# Patient Record
Sex: Female | Born: 1985 | Race: Black or African American
Health system: Southern US, Community
[De-identification: ages and names within clinical notes are randomized; demographics above are authoritative.]

## PROBLEM LIST (undated history)

## (undated) DIAGNOSIS — F32A Depression, unspecified: Secondary | ICD-10-CM

## (undated) DIAGNOSIS — F419 Anxiety disorder, unspecified: Secondary | ICD-10-CM

## (undated) DIAGNOSIS — D649 Anemia, unspecified: Secondary | ICD-10-CM

## (undated) HISTORY — DX: Anxiety disorder, unspecified: F41.9

## (undated) HISTORY — DX: Depression, unspecified: F32.A

## (undated) HISTORY — PX: WISDOM TOOTH EXTRACTION: SHX21

---
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
## 2018-09-25 ENCOUNTER — Emergency Department
Admission: EM | Admit: 2018-09-25 | Discharge: 2018-09-25 | Disposition: A | Discharge status: Home / Self Care | Payer: Self-pay | Attending: Emergency Medicine | Admitting: Emergency Medicine

## 2018-09-25 ENCOUNTER — Emergency Department: Payer: Self-pay

## 2018-09-25 DIAGNOSIS — R1030 Lower abdominal pain, unspecified: Secondary | ICD-10-CM | POA: Insufficient documentation

## 2018-09-25 DIAGNOSIS — O9989 Other specified diseases and conditions complicating pregnancy, childbirth and the puerperium: Secondary | ICD-10-CM | POA: Insufficient documentation

## 2018-09-25 DIAGNOSIS — O26892 Other specified pregnancy related conditions, second trimester: Secondary | ICD-10-CM

## 2018-09-25 DIAGNOSIS — Z3A16 16 weeks gestation of pregnancy: Secondary | ICD-10-CM | POA: Insufficient documentation

## 2018-09-25 LAB — CBC AND DIFFERENTIAL
Absolute NRBC: 0 10*3/uL (ref 0.00–0.00)
Basophils Absolute Automated: 0.02 10*3/uL (ref 0.00–0.08)
Basophils Automated: 0.2 %
Eosinophils Absolute Automated: 0.06 10*3/uL (ref 0.00–0.44)
Eosinophils Automated: 0.6 %
Hematocrit: 30.2 % — ABNORMAL LOW (ref 34.7–43.7)
Hgb: 10.2 g/dL — ABNORMAL LOW (ref 11.4–14.8)
Immature Granulocytes Absolute: 0.05 10*3/uL (ref 0.00–0.07)
Immature Granulocytes: 0.5 %
Lymphocytes Absolute Automated: 2.67 10*3/uL (ref 0.42–3.22)
Lymphocytes Automated: 25.9 %
MCH: 29.4 pg (ref 25.1–33.5)
MCHC: 33.8 g/dL (ref 31.5–35.8)
MCV: 87 fL (ref 78.0–96.0)
MPV: 9.9 fL (ref 8.9–12.5)
Monocytes Absolute Automated: 0.68 10*3/uL (ref 0.21–0.85)
Monocytes: 6.6 %
Neutrophils Absolute: 6.84 10*3/uL — ABNORMAL HIGH (ref 1.10–6.33)
Neutrophils: 66.2 %
Nucleated RBC: 0 /100 WBC (ref 0.0–0.0)
Platelets: 224 10*3/uL (ref 142–346)
RBC: 3.47 10*6/uL — ABNORMAL LOW (ref 3.90–5.10)
RDW: 13 % (ref 11–15)
WBC: 10.32 10*3/uL — ABNORMAL HIGH (ref 3.10–9.50)

## 2018-09-25 LAB — COMPREHENSIVE METABOLIC PANEL
ALT: <6 U/L (ref 0–55)
AST (SGOT): 9 U/L (ref 5–34)
Albumin/Globulin Ratio: 0.8 — ABNORMAL LOW (ref 0.9–2.2)
Albumin: 3 g/dL — ABNORMAL LOW (ref 3.5–5.0)
Alkaline Phosphatase: 77 U/L (ref 37–106)
Anion Gap: 7 (ref 5.0–15.0)
BUN: 11 mg/dL (ref 7.0–19.0)
Bilirubin, Total: 0.2 mg/dL (ref 0.2–1.2)
CO2: 25 mEq/L (ref 22–29)
Calcium: 8.8 mg/dL (ref 8.5–10.5)
Chloride: 106 mEq/L (ref 100–111)
Creatinine: 0.6 mg/dL (ref 0.6–1.0)
Globulin: 3.6 g/dL (ref 2.0–3.6)
Glucose: 107 mg/dL — ABNORMAL HIGH (ref 70–100)
Potassium: 3.9 mEq/L (ref 3.5–5.1)
Protein, Total: 6.6 g/dL (ref 6.0–8.3)
Sodium: 138 mEq/L (ref 136–145)

## 2018-09-25 LAB — URINALYSIS REFLEX TO MICROSCOPIC EXAM - REFLEX TO CULTURE
Bilirubin, UA: NEGATIVE
Glucose, UA: NEGATIVE
Leukocyte Esterase, UA: NEGATIVE
Nitrite, UA: NEGATIVE
Protein, UR: NEGATIVE
Specific Gravity UA: >1.03 (ref 1.001–1.035)
Urine pH: 6 (ref 5.0–8.0)
Urobilinogen, UA: 0.2 mg/dL (ref 0.2–2.0)

## 2018-09-25 LAB — LIPASE: Lipase: 12 U/L (ref 8–78)

## 2018-09-25 LAB — GFR: EGFR: >60

## 2018-09-25 LAB — ABO/RH: ABO Rh: O POS

## 2018-09-25 MED ORDER — ACETAMINOPHEN 325 MG PO TABS
650.0000 mg | ORAL_TABLET | Freq: Once | ORAL | Status: AC
Start: 2018-09-25 — End: 2018-09-25
  Administered 2018-09-25: 21:00:00 650 mg via ORAL
  Filled 2018-09-25: qty 2

## 2018-09-25 MED ORDER — SODIUM CHLORIDE 0.9 % IV BOLUS
1000.00 mL | Freq: Once | INTRAVENOUS | Status: AC
Start: 2018-09-25 — End: 2018-09-25
  Administered 2018-09-25: 21:00:00 1000 mL via INTRAVENOUS

## 2018-09-25 NOTE — ED Provider Notes (Signed)
EMERGENCY DEPARTMENT HISTORY AND PHYSICAL EXAM     Physician/Midlevel provider first contact with patient: 09/25/18 2055         Date: 09/25/2018  Patient Name: Casey Yates    History of Presenting Illness and Plan     Chief Complaint   Patient presents with   . Abdominal Pain       History Provided By: Patient    Chief Complaint: Abdominal pain  Duration: Hours  Timing: Gradual  Location: Suprapubic  Quality: Aching  Severity: Mild  Exacerbating factors: None  Alleviating factors: None  Associated Symptoms: None  Pertinent Negatives: Fever, dysuria, vaginal bleeding.         PCP: Pcp, None, MD  SPECIALISTS:    Current Facility-Administered Medications   Medication Dose Route Frequency Provider Last Rate Last Dose   . sodium chloride 0.9 % bolus 1,000 mL  1,000 mL Intravenous Once Suzy Bouchard, MD 1,000 mL/hr at 09/25/18 2111 1,000 mL at 09/25/18 2111     Current Outpatient Medications   Medication Sig Dispense Refill   . Prenatal Multivit-Min-Fe-FA (PRENATAL VITAMINS PO) Take by mouth         Past History     Past Medical History:  History reviewed. No pertinent past medical history.    Past Surgical History:  History reviewed. No pertinent surgical history.    Family History:  Family History   Family history unknown: Yes       Social History:  Social History     Tobacco Use   . Smoking status: Never Smoker   . Smokeless tobacco: Never Used   Substance Use Topics   . Alcohol use: Never     Frequency: Never   . Drug use: Never       Allergies:  No Known Allergies    Review of Systems     Review of Systems   Constitutional: Negative for fever.   HENT: Negative for congestion.    Eyes: Negative for pain.   Respiratory: Negative for shortness of breath.    Cardiovascular: Negative for chest pain.   Gastrointestinal: Positive for abdominal pain. Negative for diarrhea, nausea and vomiting.   Musculoskeletal: Negative for back pain.   Skin: Negative for rash.   Neurological: Negative for syncope.    Psychiatric/Behavioral: Negative for agitation.       Physical Exam   BP 129/62   Pulse 94   Temp 98.3 F (36.8 C)   Resp 18   Ht 5\' 1"  (1.549 m)   Wt 86.2 kg   SpO2 100%   BMI 35.90 kg/m     Physical Exam  Vitals signs and nursing note reviewed.   Constitutional:       Appearance: She is well-developed.   HENT:      Head: Normocephalic.   Eyes:      Conjunctiva/sclera: Conjunctivae normal.   Neck:      Musculoskeletal: Neck supple.   Cardiovascular:      Rate and Rhythm: Normal rate and regular rhythm.   Pulmonary:      Effort: Pulmonary effort is normal. No respiratory distress.      Breath sounds: Normal breath sounds. No wheezing.   Abdominal:      Palpations: Abdomen is soft.      Tenderness: There is tenderness in the suprapubic area.   Musculoskeletal:         General: No deformity.   Skin:     General: Skin is warm.   Neurological:  Mental Status: She is alert.         Diagnostic Study Results     Labs -     Results     Procedure Component Value Units Date/Time    GFR [[id number]] Collected:  09/25/18 2104     Updated:  09/25/18 2157     EGFR >60.0    Narrative:       Replace urinary catheter prior to obtaining the urine culture  if it has been in place for greater than or equal to 14  days:->No  Indications for U/A Reflex to Micro - Reflex to  Culture:->Suprapubic Pain/Tenderness or Dysuria    Comprehensive metabolic panel [[id number]]  (Abnormal) Collected:  09/25/18 2104    Specimen:  Blood Updated:  09/25/18 2157     Glucose 107 mg/dL      BUN 78.2 mg/dL      Creatinine 0.6 mg/dL      Sodium 956 mEq/L      Potassium 3.9 mEq/L      Chloride 106 mEq/L      CO2 25 mEq/L      Calcium 8.8 mg/dL      Protein, Total 6.6 g/dL      Albumin 3.0 g/dL      AST (SGOT) 9 U/L      ALT <6 U/L      Alkaline Phosphatase 77 U/L      Bilirubin, Total 0.2 mg/dL      Globulin 3.6 g/dL      Albumin/Globulin Ratio 0.8     Anion Gap 7.0    Narrative:       Replace urinary catheter prior to obtaining the urine  culture  if it has been in place for greater than or equal to 14  days:->No  Indications for U/A Reflex to Micro - Reflex to  Culture:->Suprapubic Pain/Tenderness or Dysuria    Lipase [[id number]] Collected:  09/25/18 2104    Specimen:  Blood Updated:  09/25/18 2157     Lipase 12 U/L     Narrative:       Replace urinary catheter prior to obtaining the urine culture  if it has been in place for greater than or equal to 14  days:->No  Indications for U/A Reflex to Micro - Reflex to  Culture:->Suprapubic Pain/Tenderness or Dysuria    UA Reflex to Micro - Reflex to Culture [[id number]]  (Abnormal) Collected:  09/25/18 2104     Updated:  09/25/18 2120     Urine Type Urine, Clean Ca     Color, UA Yellow     Clarity, UA Clear     Specific Gravity UA >1.030     Urine pH 6.0     Leukocyte Esterase, UA Negative     Nitrite, UA Negative     Protein, UR Negative     Glucose, UA Negative     Ketones UA Trace     Urobilinogen, UA 0.2 mg/dL      Bilirubin, UA Negative     Blood, UA Trace     RBC, UA 3 - 5 /hpf      WBC, UA 0-5 /hpf      Squamous Epithelial Cells, Urine 6-10 /hpf     Narrative:       Replace urinary catheter prior to obtaining the urine culture  if it has been in place for greater than or equal to 14  days:->No  Indications for U/A Reflex to Micro - Reflex to  Culture:->Suprapubic Pain/Tenderness  or Dysuria    CBC with differential [[id number]]  (Abnormal) Collected:  09/25/18 2104    Specimen:  Blood Updated:  09/25/18 2115     WBC 10.32 x10 3/uL      Hgb 10.2 g/dL      Hematocrit 75.6 %      Platelets 224 x10 3/uL      RBC 3.47 x10 6/uL      MCV 87.0 fL      MCH 29.4 pg      MCHC 33.8 g/dL      RDW 13 %      MPV 9.9 fL      Neutrophils 66.2 %      Lymphocytes Automated 25.9 %      Monocytes 6.6 %      Eosinophils Automated 0.6 %      Basophils Automated 0.2 %      Immature Granulocyte 0.5 %      Nucleated RBC 0.0 /100 WBC      Neutrophils Absolute 6.84 x10 3/uL      Abs Lymph Automated 2.67 x10 3/uL      Abs Mono  Automated 0.68 x10 3/uL      Abs Eos Automated 0.06 x10 3/uL      Absolute Baso Automated 0.02 x10 3/uL      Absolute Immature Granulocyte 0.05 x10 3/uL      Absolute NRBC 0.00 x10 3/uL     Narrative:       Replace urinary catheter prior to obtaining the urine culture  if it has been in place for greater than or equal to 14  days:->No  Indications for U/A Reflex to Micro - Reflex to  Culture:->Suprapubic Pain/Tenderness or Dysuria    ABO/Rh [[id number]] Collected:  09/25/18 2104    Specimen:  Blood Updated:  09/25/18 2113    Narrative:       Faye Ramsay          Radiologic Studies -   Radiology Results (24 Hour)     Procedure Component Value Units Date/Time    US OB 14+ Margaretha Sheffield Trnsvg [[id number]] Collected:  09/25/18 2154    Order Status:  Completed Updated:  09/25/18 2202    Narrative:       HISTORY: Generalized abdominal pain.    Female, 16 weeks by dates.    COMPARISON: None    EXAM: Transabdominal obstetrical ultrasound    FINDINGS: The maternal cervix is closed and measures over 3 cm. The  placenta is anterior grade 0 and there is no previa. There is a single  fetus in cephalic presentation. There is spontaneous fetal motion and  normal fetal heart rate (150 bpm). Amniotic fluid is normal. No  hemorrhage is seen.    The visualized portions of the fetal cranium, thorax, abdomen, 2 upper  extremities and 2 large arteries are normal.    No maternal adnexal mass is seen.    Following measurements were confirmed:    BPD 3.4 cm, gestational age [redacted] weeks    Head circumference 12 cm, gestational age [redacted] weeks    Abdominal circumference 12 cm, gestational age [redacted] weeks 5 days    Femur length 2.3 cm, gestational age [redacted] weeks    Head circumference abdominal circumference ratio normal          Impression:          1. There is a solitary 16 week 6 day intrauterine pregnancy in cephalic  presentation with no acute complication  seen. Follow-up with OB is an  outpatient and routine level 2 OB ultrasound at 24 weeks is  suggested.    Charlene Brooke, MD   09/25/2018 9:57 PM      .    Medical Decision Making   I am the first provider for this patient.    I reviewed the vital signs, available nursing notes, past medical history, past surgical history, family history and social history.    Vital Signs-Reviewed the patient's vital signs.     Patient Vitals for the past 12 hrs:   BP Temp Pulse Resp   09/25/18 2043 129/62 98.3 F (36.8 C) 94 18       Pulse Oximetry Analysis - Normal       Procedures:    ED Course:     10:10 PM - Patient feels better. I have discussed all testing results and plan of care with patient. All questions solicited and addressed. Possibility of evolving illness reviewed. Patient agrees with going home, following up and returning if worsening symptoms.        Provider Note: This-year-old female with past medical history of G5 P4, [redacted] weeks pregnant presents with suprapubic abdominal discomfort, no fever or dysuria or vaginal bleeding.  Vital signs stable, suprapubic discomfort.  Differential includes UTI, threatened abortion, round ligament pain, do not suspect surgical abdomen.  Plan for lab work, ultrasound, Tylenol and reassess.     Dr. Suzy Bouchard is the primary emergency doctor of record.    Critical Care Time:      Diagnosis     Clinical Impression:   1. Abdominal pain during pregnancy in second trimester        Treatment Plan:   ED Disposition     ED Disposition Condition Date/Time Comment    Discharge  Mon Sep 25, 2018 10:09 PM Casey Yates discharge to home/self care.    Condition at disposition: Stable              This note was generated by the Epic EMR system/ Dragon speech recognition and may contain inherent errors or omissions not intended by the user. Grammatical errors, random word insertions, deletions and pronoun errors  are occasional consequences of this technology due to software limitations. Not all errors are caught or corrected. If there are questions or concerns about the content  of this note or information contained within the body of this dictation they should be addressed directly with the author for clarification.  _______________________________       Suzy Bouchard, MD  09/25/18 2210

## 2020-07-22 ENCOUNTER — Encounter (HOSPITAL_BASED_OUTPATIENT_CLINIC_OR_DEPARTMENT_OTHER): Payer: Self-pay

## 2020-07-22 NOTE — Progress Notes (Signed)
[facility] Behavioral Health Prescreen     Date/Time: 8:46 AM  Interviewer: Bralen Wiltgen  Patients Name (Always update Registration for Patient): Casey Yates      Please note that [facility] is a [employer] that provides same day or next day psychiatric assessment appointments. However, Due to the current pandemic, walk-in services are on hold and they are scheduling appointments. Appointments are either via MyChart or In Person [facility] Monday-Thursday and MyChart Video only on Friday. [facility] does not prescribe controlled medications such as stimulants, benzo's or opiates and they also do not provide ongoing psychiatric care.  Would you like to continue with the prescreen for [facility] scheduling: yes    It is a requirement that you are within the [facility] [place] [facility] [place] for all [facility] Telemedicine visit. Do you have any concerns about your ability to do this? no  (If Patient resides outside of [place], to have to be scheduled to come in person)    Are you seeking behavioral health services due to present legal issue? no  Note: REFER OUT IF APPROPRIATE      Do you require Hard of Hearing Services? no  Do you require Language Services? no    Review of ADA Special Needs Assessment form was completed verbally: yes  Review of patient rights and responsibilities form with patient was completed verbally: yes  Review of Authorization for Claims Payment and Review form was completed verbally: yes      Are you currently experiencing fever, cough or shortness of breath? Patient stated, " no"   Are you currently experiencing chills, sore throat, new headache, loss of taste or smell, or body aches not attributable to physical activity? Patient stated, " no"   Have you had close contact with a COVID-19 patient?  Patient stated, "no"  Have you recently been tested for COVID-19? Patient stated, " Yes, tested yesterday, still pending"  Have you ever been diagnosed with COVID-19? Patient stated, " Yes in January  2021"  Do you live in a group living residence, such as an assisted living facility, nursing home, shelter or dormitory?  Patient stated, "no"     If you plan to come in person, do you need a support person (friend or family member to attend the visit)? Patient stated, " no"    Who is referring you for Services: "self"  (Eg: Doctor, ED, SELF, Insurance)    SCREENING:    Reason for Referral: Patient stated, " My anxiety.  I eat less, sometimes I cry, get overwhelmed, blame my self for things"    Are you presently receiving behavioral health services from an outpatient provider?  Patient stated, "No, have not seen one since 3-4 years"  If currently seeing a psychiatric prescriber, why are you seeking services at [facility]? n/a  Note: [facility] does not provide second opinion.     Have you had suicide thought within the last month? Patient stated, "no"  If YES what were the circumstances? n/a  When was the last time within the 30 days that you felt this way? n/a  Are you presently experiencing Suicidal Ideation with a plan or intent? Patient stated, "no"  Note: Note: REFER OUT IF APPROPRIATE      Are you currently experiencing Homicidal ideation with a plan or intent: Patient stated, "no"  Note: Note: REFER OUT IF APPROPRIATE          Are you currently using alcohol or drugs? Patient stated, "no"  If YES please complete  for all reported alcohol or drugs  Drug: n/a  Pattern (Route, Amount) of use: n/a  Date of last Use: n/a  Note: REFER OUT IF APPROPRIATE      Additional Comment:n/a      Reminders:   To cancel or reschedule please call us as soon as possible.    Please have the most up to date list/with dosages of your medication for your appointment & pharmacy information   Please note that all visits occur via MyChart or In person [facility]    Psychiatry   If a Pt is requesting non-clinical documentation (i.e. Disability evaluation), please refer to the following information:   Our mental health services are therapeutic in  nature and assessment is limited to clinical evaluation and treatment.  Assessments that do not fall under [facility]'s scope of services include:  Court-ordered compliance   Evidence of disability   Court mandated services resulting in the provider testifying on your behalf  Expert witnesses in court cases  Impressions for or fitness for custody   Evaluations for emotional support animals  Disability or FMLA paperwork  Psychological testing (includes ADHD testing and dementia evaluation)

## 2020-07-23 ENCOUNTER — Encounter (HOSPITAL_BASED_OUTPATIENT_CLINIC_OR_DEPARTMENT_OTHER): Payer: Self-pay

## 2020-07-23 ENCOUNTER — Encounter (HOSPITAL_BASED_OUTPATIENT_CLINIC_OR_DEPARTMENT_OTHER): Payer: Self-pay | Admitting: Psychiatry

## 2020-07-23 ENCOUNTER — Telehealth (HOSPITAL_BASED_OUTPATIENT_CLINIC_OR_DEPARTMENT_OTHER): Payer: BC Managed Care – PPO | Admitting: Psychiatry

## 2020-07-23 VITALS — BP 122/80 | HR 73 | Temp 98.3°F | Ht 61.0 in | Wt 186.0 lb

## 2020-07-23 DIAGNOSIS — F321 Major depressive disorder, single episode, moderate: Secondary | ICD-10-CM

## 2020-07-23 DIAGNOSIS — F4010 Social phobia, unspecified: Secondary | ICD-10-CM

## 2020-07-23 DIAGNOSIS — F439 Reaction to severe stress, unspecified: Secondary | ICD-10-CM

## 2020-07-23 DIAGNOSIS — F99 Mental disorder, not otherwise specified: Secondary | ICD-10-CM

## 2020-07-23 DIAGNOSIS — F4322 Adjustment disorder with anxiety: Secondary | ICD-10-CM

## 2020-07-23 DIAGNOSIS — F5105 Insomnia due to other mental disorder: Secondary | ICD-10-CM

## 2020-07-23 MED ORDER — HYDROXYZINE HCL 25 MG PO TABS
ORAL_TABLET | ORAL | 2 refills | Status: AC
Start: 2020-07-23 — End: ?

## 2020-07-23 MED ORDER — SERTRALINE HCL 50 MG PO TABS
25.00 mg | ORAL_TABLET | ORAL | 2 refills | Status: AC
Start: 2020-07-23 — End: 2020-10-21

## 2020-07-23 NOTE — Progress Notes (Signed)
[facility] Behavioral Health Psychiatric Evaluation    Date/Time:   07/23/2020  8:08 AM  Name:  Casey Yates, Casey Yates  MRN:    [id number]  Age:   34 y.o.  DOB:   1986/08/24  Sex:  female    CHIEF COMPLAINT  "To restart medications for anxiety and depression". "I am feeling overwhelmed, very depressed, blaming self with crying episodes".    HISTORY OF PRESENT ILLNESS  This visit is being conducted via My Chart [facility] to minimize exposure to COVID-19.  Patient was at [facility].    Patient is a 34 years old married, full-time employed African-American female with history of anxiety and depression in the past and current symptoms started since postpartum period of her last child in 2020.  She was treated by her PCP with as needed Ativan for 4 to 5 years, 5 years ago.  She has never been on any long-term medications despite one psychiatric admission at [facility] in 2012 or 2013. She was referred to a therapy group.  She was seeing a therapist last year then stopped when the therapist left the practice.    Reports that she is very overwhelmed, working full-time in radiology registration department with [employer] and she is the most senior after her supervisor left.  She is also taking care of her 5 sons, ages from 26 to 1 y/o and studying full-time at [employer] for [employer].  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    Her depressive symptoms, 7 out of 9 symptoms except psychomotor retardation and suicidal ideations meet criteria for MDD moderate episode.  Denies active or passive suicidal ideations.  Her anxiety symptoms seems to be secondary to current life stressors.  Denies trauma history, OCD or GAD symptoms.  She also reports social anxiety since young age which interfere with her functioning.  She needed a drink in social settings, her mind went blank in group interactions.  Denies drugs or alcohol abuse.    Patient is currently not pregnant, on Depo-Provera shots for birth control.    PSYCHIATRIC  REVIEW OF SYMPTOMS  Subjective Mood: "Anxiety and depression"   Sleep: Onset difficulty, Maintenance difficulty 5 hrs since August 2020   Appetite/Weight: Decreased / Known loss of 10 lbs over  last 1-2 months   Focus & Concentration: Poor Focus/Distractible   Energy Level: Decreased   Delusions:  Denies Delusional Content   Hallucinations: Denies any Hallucinosis   Suicide or Self-Injury?: Yes, active in 2012   Homicide or Violence?: No   Access to Guns?: No       PAST PSYCHIATRIC HISTORY  Current Provider(s) Therapist 2/3 times last year until she left the practice. 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    No past surgical history on file.    No Known Allergies       PSYCHIATRIC SPECIALITY & MENTAL STATUS EXAM  Vital Signs There were no vitals taken for this visit.   General Appearance Neatly groomed, appropriately dressed and  adequately nourished   Muskuloskeletal No weakness, abnormal movements, or other impairments in head and neck regions   Gait/Station  Unable to assess   Speech Normal Rate, Rythym, & Volume   Thought Process Logical, Linear, Goal Directed   Associations Intact    Thought Content No evidence of homicidal, suicidal, violent, or delusional thought content   Perceptions No evidence of hallucinosis   Judgment No Impairment   Insight  Fair   LOC/Orientation A&O x 4, Sensorium Clear   Memory Impaired:   Immediate and Recent   Attention & Concentration Normal   Fund of Knowledge Adequate given patient age, socioeconomic status, and educational level   Language Fluent with no impairments in comprehension or expression   Mood Depressed, Anxious   Affect Full-Range / Expressive     CSSRS Questions:          C-SSRS Suicidal Ideation Severity    Ask questions that are in italics for the past month. yes no   1)    Wish to be dead  []    [x]        In the past month, Have you wished you were dead or wished you could go to sleep and not wake up?     2)   Current suicidal thoughts  []    [x]        In the past month, Have you actually had any thoughts of killing yourself?     IF YES TO 2, ASK 3, 4,5. IF NO TO 2, GO TO QUESTION 6.         3)   Suicidal thoughts w/ Method (w/no specific Plan or Intent or act)  []   []        In the past month, Have you been thinking about how you might do this?        (I.e. thoughts to overdose; etc.)          4)    Suicidal Intent without Specific Plan  []    []        In the past month, Have you had these thoughts and had some intention of acting on them?    (I.e. having thoughts to overdose with some intent to act on these thoughts)           5)    Intent with Plan  []    []        In the past month, Have you started to work out or worked out the details of how to kill yourself? Do you intend to carry out this plan?    (I.e. plan to overdose on Tuesday with  intent to do so on Tuesday vs. General thought for  Tuesday)        6) C-SSRS Suicidal Behavior: "Have you ever done anything, started to do anything, or prepared to do anything to end your life? Lifetime Lifetime      []    [x]       3-2 months ago  1 month ago     If YES Was it within the past 3 months?  []   []    []     [ IF ORANGE OR RED, COMPLETE [facility] STEP 3 IN THIS SPACE. ]     ASSESSMENT  34 y.o. female with no significant medical illnesses, positive for family history of anxiety, depression and substance abuse, social anxiety since young age, depression in the past with 1 psychiatric hospitalization in 2012 or 2013 but never been on long-term antianxiety or antidepressant medications.  She continued treatment with psychotherapists. she  presented with moderate MDD adjustment disorder with anxiety and underlying social anxiety symptoms secondary to life stressors. Denies history of psychosis/SI/SAs or significant substance abuse.      There is no safety concerns at this time as patient is not suicidal/homicidal/psychotic.   Reviewed risks/benefits of treatment options as well as potential side effects of medications and medication interactions.     Patient verbally consented to the prescribed medications due to the nature of tele video visit via Epic Mychart to minimize exposure to COVID19.    Patient agrees with safety plan which includes calling [place] call center or 911 or go to ER if needed and to follow up with treatment recommendations.  All questions answered and concerns addressed.       ENCOUNTER DIAGNOSIS    1. Social anxiety disorder     2. Current moderate episode of major depressive disorder, unspecified whether recurrent     3. 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 Stress           PLAN  Treatment options and alternatives reviewed with patient, along with detailed discussion of medication(s) and side effects, and they concur with following plan:    Medications:   Start Zoloft 25 mg daily for 1 week  then increase to 50 mg   Atarax 12.5 mg - 25 mg daily as needed for anxiety and may use up to 50 mg at bedtime as needed for sleep    Therapies:   Psychotherapy: [facility] to arrange thru their Insurance Panel, [facility], or other external referral   PHP: PHP not clinically relevant at this time    Labs/Other:   None    DISPOSITION & FOLLOW-UP   Discharge to: Self-care   Follow-up: Patient will schedule with an in network psychiatrist.   Return to [facility]    _____________________________________________  Janetta Hora, MD

## 2020-07-23 NOTE — Patient Instructions (Signed)
MEDICATION INSTRUCTIONS                    1. Take all medications as prescribed; do not stop medications or change dosages without talking to your provider(s).  2. Abstain from alcohol and/or illegal drugs as they interfere with psychiatric medications.   3. Immediately go to the nearest emergency department or call 911 if you have any thoughts of wanting to harm yourself or others, or for any other crisis.  4. Consult with your pharmacist if you questions about your medications, their side effects or possible interactions with other medications you take.      FOLLOW-UP CARE APPOINTMENTS    1. PSYCHIATRIC MEDICATION MANAGEMENT: a trial of Zoloft, Atarax as needed for anxiety and sleep.  2. PSYCHOTHERAPY: If you do not already have a therapist, find one by contact your insurance provider for a list of in-network providers. Time-limited, psychotherapy programs are available through [facility] by calling [phone]. Another source for therapists is [email or web address].psychology-today.[email or web address]  3. Return to [facility] in 2 to 4 weeks, or anytime if necessary (but not for routine matters like refills). [facility] hours are 8a,-4pm Monday thru Friday, excluding Major Holidays. [facility] is currently seeing patients by appointment only.      ABOUT YOUR MEDICATIONS:      Atarax Oral Tablet 25 mg  Uses  This medicine is used for the following purposes:   agitation   anxiety   itching  Instructions  This medicine may be taken with or without food.  Store at room temperature in a dry place. Do not keep in the bathroom.  Keep the medicine away from heat and light.  Please tell your doctor and pharmacist about all the medicines you take. Include both prescription and over-the-counter medicines. Also tell them about any vitamins, herbal medicines, or anything else you take for your health.  If your symptoms do not improve or they worsen while on this medicine, contact your doctor.  Cautions  Tell your doctor and pharmacist if you ever had an allergic  reaction to a medicine. Symptoms of an allergic reaction can include trouble breathing, skin rash, itching, swelling, or severe dizziness.  Do not use the medication any more than instructed.  Your ability to stay alert or to react quickly may be impaired by this medicine. Do not drive or operate machinery until you know how this medicine will affect you.  Please check with your doctor before drinking alcohol while on this medicine.  Tell the doctor or pharmacist if you are pregnant, planning to be pregnant, or breastfeeding.  Ask your pharmacist if this medicine can interact with any of your other medicines. Be sure to tell them about all the medicines you take.  Please tell all your doctors and dentists that you are on this medicine before they provide care.  Do not start or stop any other medicines without first speaking to your doctor or pharmacist.  Do not share this medicine with anyone who has not been prescribed this medicine.  Side Effects  The following is a list of some common side effects from this medicine. Please speak with your doctor about what you should do if you experience these or other side effects.   constipation   dizziness   drowsiness or sedation   dry mouth  Call your doctor or get medical help right away if you notice any of these more serious side effects:   confusion   fainting   hallucinations (unusual thoughts, seeing  or hearing things that are not real)   fast or irregular heart beats   mood changes   seizures   shakiness   difficulty or discomfort urinating   blurring or changes of vision  A few people may have an allergic reactions to this medicine. Symptoms can include difficulty breathing, skin rash, itching, swelling, or severe dizziness. If you notice any of these symptoms, seek medical help quickly.  Extra  Please speak with your doctor, nurse, or pharmacist if you have any questions about this medicine.  [email or web address]  IMPORTANT  NOTE: This document tells you briefly how to take your medicine, but it does not tell you all there is to know about it.Your doctor or pharmacist may give you other documents about your medicine. Please talk to them if you have any questions.Always follow their advice. There is a more complete description of this medicine available in [place].Scan this code on your smartphone or tablet or use the web address below. You can also ask your pharmacist for a printout. If you have any questions, please ask your pharmacist.    2021 [employer]             Zoloft Oral Tablet 50 mg   Uses  This medicine is used for the following purposes:   anxiety   depression   eating disorders   obsessive compulsive disorder   post-traumatic stress disorder  Instructions  This medicine may be taken with or without food.  It is very important that you take the medicine at about the same time every day. It will work best if you do this.  Keep the medicine at room temperature. Avoid heat and direct light.  It is important that you keep taking each dose of this medicine on time even if you are feeling well.  If you forget to take a dose on time, take it as soon as you remember. If it is almost time for the next dose, do not take the missed dose. Return to your normal dosing schedule. Do not take 2 doses of this medicine at one time.  Please tell your doctor and pharmacist about all the medicines you take. Include both prescription and over-the-counter medicines. Also tell them about any vitamins, herbal medicines, or anything else you take for your health.  Do not suddenly stop taking this medicine. Check with your doctor before stopping.  Cautions  Tell your doctor and pharmacist if you ever had an allergic reaction to a medicine. Symptoms of an allergic reaction can include trouble breathing, skin rash, itching, swelling, or severe dizziness.  Do not use the medication any more than instructed.  Your ability to stay alert or  to react quickly may be impaired by this medicine. Do not drive or operate machinery until you know how this medicine will affect you.  Please check with your doctor before drinking alcohol while on this medicine.  Family should check on the patient often. Call the doctor if patient becomes more depressed, has thoughts of suicide, or shows changes in behavior.  Call the doctor if there are any signs of confusion or unusual changes in behavior.  Tell the doctor or pharmacist if you are pregnant, planning to be pregnant, or breastfeeding.  Ask your pharmacist if this medicine can interact with any of your other medicines. Be sure to tell them about all the medicines you take.  Do not start or stop any other medicines without first speaking to your doctor or pharmacist.  Call your doctor right away if you notice any unusual bleeding or bruising.  If you have painful erection or an erection for more than 4 hours, seek medical care right away.  Do not share this medicine with anyone who has not been prescribed this medicine.  This medicine can cause serious side effects in some patients. Important information from the U.S. Food and Drug Administration (FDA) is available from your pharmacist. Please review it carefully with your pharmacist to understand the risks associated with this medicine.  Side Effects  The following is a list of some common side effects from this medicine. Please speak with your doctor about what you should do if you experience these or other side effects.   agitated feeling or trouble sleeping   decreased appetite   diarrhea   dizziness   drowsiness or sedation   dry mouth   nausea   stomach upset or abdominal pain   sweating   weight loss  Call your doctor or get medical help right away if you notice any of these more serious side effects:   loss of balance   coughing up blood or vomit that looks like coffee grounds   pain in the eye   fainting   hallucinations (unusual thoughts,  seeing or hearing things that are not real)   fast or irregular heart beats   muscle weakness   dilation of the pupils   restlessness   problems with sexual functions or desire   shakiness   dark, tarry stool   blurring or changes of vision   severe or persistent vomiting   unexpected or extreme weight loss  A few people may have an allergic reactions to this medicine. Symptoms can include difficulty breathing, skin rash, itching, swelling, or severe dizziness. If you notice any of these symptoms, seek medical help quickly.  Extra  Please speak with your doctor, nurse, or pharmacist if you have any questions about this medicine.  [email or web address]  IMPORTANT NOTE: This document tells you briefly how to take your medicine, but it does not tell you all there is to know about it.Your doctor or pharmacist may give you other documents about your medicine. Please talk to them if you have any questions.Always follow their advice. There is a more complete description of this medicine available in [place].Scan this code on your smartphone or tablet or use the web address below. You can also ask your pharmacist for a printout. If you have any questions, please ask your pharmacist.    2021 [employer]          [facility]    [facility] Call Center  - [phone] 24/7/365  For admissions and screening for all [facility] Services, including:   Comprehensive Addiction Treatment Services (CATS) Inpatient Detox, IOP Intensive Outpatient Programs   Partial Hospitalization Program (PHP), Outpatient psychiatry, Outpatient counseling        [facility]  [facility]iatric [facility]  [address]  [place], [place] [address] For Urgent Adult (18 and Over) Psychiatric Assessments [phone]     [facility]  [address]  [place], [place] [address]   For Child and Youth (Under 18) Mental Health and Substance Abuse Outpatient Services  [phone]   [facility] [place]  [address]  [place], [place] [address] For Non-Urgent Psychiatric Appointments: [phone]   [facility]-   [facility]  [phone]  [address]  [place], [place] [address]   For Non-Urgent Psychiatric Appointments: [phone]   [facility]  [address] [place], [place] [address]   For Non-Urgent Psychiatric Appointments: [phone]   [facility]- [address]  [address]  [place], [place] [address]   For Non-Urgent Psychiatric Appointments: [phone]   [facility]  [address]   [place], [place] [address]   For Non-Urgent Psychiatric Appointments: [phone]         COMMUNITY RESOURCES (MENTAL HEALTH CENTERS):      [facility]  Entry and Referral Services [phone]     [facility], [place], [place] [phone]   [facility], [place], [place] [phone]     [place], [place], [place] [phone]     [facility], [place], [place] [phone]     [facility], [place], [place]  [phone]       [facility], [place], [place] [phone]     [facility], [place], [place]  [phone]       [facility] [address]  [place]) [phone]              [facility] - [phone]

## 2020-07-23 NOTE — Progress Notes (Signed)
Writer took vitals for In-patient who is only taking a multi-vitamin at this time. No known allergies. Escorted patient to office for video visit with Dr. Arta Silence.

## 2020-08-05 ENCOUNTER — Encounter (HOSPITAL_BASED_OUTPATIENT_CLINIC_OR_DEPARTMENT_OTHER): Payer: Self-pay

## 2020-08-05 NOTE — Progress Notes (Signed)
[facility] Behavioral Health Prescreen     Date/Time: 10:10 AM  Interviewer: Paisly Fingerhut  Patients Name (Always update Registration for Patient): Casey Yates    No appt scheduled, during the call pt stated she was looking for an appt in the future.  PAAII informed pt the [facility] only schedules appt 1-2 days out.  To call back [phone] before her medications run out.    Please note that [facility] is a [facility] that provides same day or next day psychiatric assessment appointments. However, Due to the current pandemic, walk-in services are on hold and they are scheduling appointments. Appointments are either via MyChart or In Person [facility] Monday-Thursday and MyChart Video only on Friday. [facility] does not prescribe controlled medications such as stimulants, benzo's or opiates and they also do not provide ongoing psychiatric care.  Would you like to continue with the prescreen for [facility] scheduling: yes    It is a requirement that you are within the [place] for all [facility] Telemedicine visit. Do you have any concerns about your ability to do this? no  (If Patient resides outside of [place], to have to be scheduled to come in person)    Are you seeking behavioral health services due to present legal issue? no  Note: REFER OUT IF APPROPRIATE      Do you require Hard of Hearing Services? no  Do you require Language Services? no    Review of ADA Special Needs Assessment form was completed verbally: yes  Review of patient rights and responsibilities form with patient was completed verbally: yes  Review of Authorization for Claims Payment and Review form was completed verbally: yes      Are you currently experiencing fever, cough or shortness of breath?  no   Are you currently experiencing chills, sore throat, new headache, loss of taste or smell, or body aches not attributable to physical activity?  no   Have you had close contact with a COVID-19 patient?  no   Have you recently  been tested for COVID-19?  Yes - sept 20th, negative   Have you ever been diagnosed with COVID-19?  Yes January 9th 2021  Do you live in a group living residence, such as an assisted living facility, nursing home, shelter or dormitory?  no     If you plan to come in person, do you need a support person (friend or family member to attend the visit)? No     Who is referring you for Services: "f/u"  (Eg: Doctor, ED, SELF, Insurance)    SCREENING:    Reason for Referral: Patient stated, " I need a f/u appt for medication management.  It was prescribed my Dr. Arta Silence.  I am still on hydroxyzine and Zoloft.  Clinical depression and social anxiety"    Are you presently receiving behavioral health services from an outpatient provider?  Patient stated, "no"  If currently seeing a psychiatric prescriber, why are you seeking services at [facility]? n/a  Note: [facility] does not provide second opinion.     Have you had suicide thought within the last month? Patient stated, "no"  If YES what were the circumstances? n/a  When was the last time within the 30 days that you felt this way? n/a  Are you presently experiencing Suicidal Ideation with a plan or intent? Patient stated, "  No"  Note: Note: REFER OUT IF APPROPRIATE      Are you currently experiencing Homicidal ideation with a  plan or intent: Patient stated, "no"  Note: Note: REFER OUT IF APPROPRIATE          Are you currently using alcohol or drugs? Patient stated, "no"  If YES please complete for all reported alcohol or drugs  Drug: n/a  Pattern (Route, Amount) of use: n/a  Date of last Use: n/a  Note: REFER OUT IF APPROPRIATE      Additional Comment:n/a      Reminders:   To cancel or reschedule please call us as soon as possible.    Please have the most up to date list/with dosages of your medication for your appointment & pharmacy information   Please note that all visits occur via MyChart or In person [facility]    Psychiatry   If a Pt is requesting non-clinical documentation (i.e.  Disability evaluation), please refer to the following information:   Our mental health services are therapeutic in nature and assessment is limited to clinical evaluation and treatment.  Assessments that do not fall under [facility]'s scope of services include:  Court-ordered compliance   Evidence of disability   Court mandated services resulting in the provider testifying on your behalf  Expert witnesses in court cases  Impressions for or fitness for custody   Evaluations for emotional support animals  Disability or FMLA paperwork  Psychological testing (includes ADHD testing and dementia evaluation)

## 2020-09-08 ENCOUNTER — Emergency Department: Payer: BC Managed Care – PPO

## 2020-09-08 ENCOUNTER — Emergency Department
Admission: EM | Admit: 2020-09-08 | Discharge: 2020-09-08 | Disposition: A | Discharge status: Home / Self Care | Payer: BC Managed Care – PPO | Attending: Student in an Organized Health Care Education/Training Program | Admitting: Student in an Organized Health Care Education/Training Program

## 2020-09-08 DIAGNOSIS — M79651 Pain in right thigh: Secondary | ICD-10-CM | POA: Insufficient documentation

## 2020-09-08 LAB — URINE BHCG POC
Urine bHCG POC: POSITIVE — AB
Urine bHCG POC: NEGATIVE

## 2020-09-08 MED ORDER — IBUPROFEN 600 MG PO TABS
600.0000 mg | ORAL_TABLET | Freq: Once | ORAL | Status: AC
Start: 2020-09-08 — End: 2020-09-08
  Administered 2020-09-08: 14:00:00 600 mg via ORAL
  Filled 2020-09-08: qty 1

## 2020-09-08 MED ORDER — ACETAMINOPHEN 500 MG PO TABS
1000.0000 mg | ORAL_TABLET | Freq: Once | ORAL | Status: AC
Start: 2020-09-08 — End: 2020-09-08
  Administered 2020-09-08: 14:00:00 1000 mg via ORAL
  Filled 2020-09-08: qty 2

## 2020-09-08 NOTE — Discharge Instructions (Signed)
Leg Pain    You have been diagnosed with leg pain.    Many things can cause leg pain. Most of the causes are not dangerous and will get better on their own. These can include muscle cramps, bruises, strains, pinched nerves, and minor skin infections.    You had an exam by your doctor today. He or she decided that the cause of your leg pain does not seem to be serious or dangerous.    During your visit, you had an ultrasound (duplex doppler) to make sure that you didn't have a blood clot in your leg.    If your pain continues, you might need another exam or more tests. This is to find out why you have leg pain. The cause of your symptoms doesn't seem dangerous now. You don't need to stay in the hospital.    Though we don't believe your condition is dangerous right now, it is important to be careful. Sometimes a problem that seems mild can become serious later. This is why it is very important that you return here or go to the nearest Emergency Department if you are not improving or your symptoms are getting worse.    Some things you may try at home are:    Over-the-counter pain medications like that have ibuprofen (Advil/Motrin) or acetaminophen (Tylenol) in them. Follow the directions on the package.   Rest the leg as needed. As soon as you are able, start moving your leg again to keep it from getting stiff.    Return here or go to the nearest Emergency Department or follow-up with your doctor if you are not getting better as expected.    Follow the instructions for any medication you are prescribed.     YOU SHOULD SEEK MEDICAL ATTENTION IMMEDIATELY, EITHER HERE OR AT THE NEAREST EMERGENCY DEPARTMENT, IF ANY OF THE FOLLOWING HAPPENS:   You have a fever (temperature higher than 100.4F or 38C).   Your pain does not go away or gets worse.   Your leg or joints (hip, knee, ankle, etc.) are red or swollen.   Your chest hurts or you get short of breath.   You have any other symptoms or concerns, or  don't get better as expected.    If you can't follow up with your doctor, or if at any time you feel you need to be rechecked or seen again, come back here or go to the nearest emergency department.

## 2020-09-08 NOTE — ED Provider Notes (Signed)
EMERGENCY DEPARTMENT NOTE     ED Physician: Jerl Santos, MD    HISTORY OF PRESENT ILLNESS     Chief Complaint: Leg Pain       Mechanism of Injury:       34 y.o. female with PMH anxiety, depression presents with right thigh pain. Denies any trauma or any recent exacerbating events.     1. Onset/Frequency: States it started 2 to 3 days ago and has been constant  2. Location/Radiation: At the right medial thigh radiating to her right gluteus and hip  3. Severity/Quality: Mild to moderate, throbbing  4. Associated symptoms: Denies any symptoms such as weakness, paresthesias, chest pain, leg swelling  5. Improved/worsened by: Worse with movement, improves with rest     MEDICAL HISTORY     Past Medical History:  Past Medical History:   Diagnosis Date    Anxiety     Depression     Outpatient Medication:  Discharge Medication List as of 09/08/2020  4:23 PM      CONTINUE these medications which have NOT CHANGED    Details   hydrOXYzine (ATARAX) 25 MG tablet 12.5 mg to 25 mg as needed for anxiety up to 50 mg PRN for sleep., E-Rx      Multiple Vitamins-Minerals (multivitamin with minerals) tablet Take 1 tablet by mouth daily "Womens", Historical Med      sertraline (ZOLOFT) 50 MG tablet Take 0.5 tablets (25 mg total) by mouth after breakfast Increase to 50 mg after 1 week., Starting Wed 07/23/2020, Until Tue 10/21/2020, E-Rx            Past Surgical History:  History reviewed. No pertinent surgical history. Family History:  Family History   Problem Relation Age of Onset    Anxiety disorder Mother     Depression Mother     Alcohol abuse Mother     Drug abuse Mother     ADD / ADHD Son     ADD / ADHD Son     Anxiety disorder Niece     Depression Niece       Social History:  Social History     Socioeconomic History    Marital status: Married     Spouse name: Not on file    Number of children: Not on file    Years of education: Not on file    Highest education level: Not on file   Occupational History    Not  on file   Tobacco Use    Smoking status: Never Smoker    Smokeless tobacco: Never Used   Substance and Sexual Activity    Alcohol use: Yes     Comment: social    Drug use: Never    Sexual activity: Not on file   Other Topics Concern    Not on file   Social History Narrative    Not on file     Social Determinants of Health     Financial Resource Strain:     Difficulty of Paying Living Expenses: Not on file   Food Insecurity:     Worried About Programme researcher, broadcasting/film/video in the Last Year: Not on file    [employer] of Food in the Last Year: Not on file   Transportation Needs:     Lack of Transportation (Medical): Not on file    Lack of Transportation (Non-Medical): Not on file   Physical Activity:     Days of Exercise per Week: Not on file  Minutes of Exercise per Session: Not on file   Stress:     Feeling of Stress : Not on file   Social Connections:     Frequency of Communication with Friends and Family: Not on file    Frequency of Social Gatherings with Friends and Family: Not on file    Attends Religious Services: Not on file    Active Member of Clubs or Organizations: Not on file    Attends Banker Meetings: Not on file    Marital Status: Not on file   Intimate Partner Violence:     Fear of Current or Ex-Partner: Not on file    Emotionally Abused: Not on file    Physically Abused: Not on file    Sexually Abused: Not on file   Housing Stability:     Unable to Pay for Housing in the Last Year: Not on file    Number of Places Lived in the Last Year: Not on file    Unstable Housing in the Last Year: Not on file        REVIEW OF SYSTEMS   Review of Systems   Constitutional: Negative for fever.   Cardiovascular: Negative for leg swelling.   Musculoskeletal: Positive for myalgias.   Skin: Negative for rash.   All other systems reviewed and are negative.        PHYSICAL EXAM     ED Triage Vitals [09/08/20 1217]   Enc Vitals Group      BP 122/77      Heart Rate 61      Resp Rate 18      Temp        Temp Source Oral      SpO2 99 %      Weight 82.1 kg      Height 1.549 m      Head Circumference       Peak Flow       Pain Score 7      Pain Loc       Pain Edu?       Excl. in GC?          Constitutional: In no distress  Eyes: Pupils equal, non-icteric  HENT: NC/AT, No drainage from ears or nose, MMM   CV: Normal rate, S1, S2, 2+ radial pulse  Resp: Normal effort, clear bilaterally  GI: Soft, non-distended, no tenderness, bowel sounds present  MSK: No acute injuries on extremities, tenderness along the right medial thigh and inguinal region with normal range of motion, strength and sensation throughout  Neuro: A+Ox3, face symmetric, hearing intact to voice, moving extremities spontaneous   Psych: Behaving normally  Skin: Warm, dry    MEDICAL DECISION MAKING     DISCUSSION:  34 year old female presenting with right thigh pain radiating to the gluteus. Differential includes DVT versus soft tissue formation versus sciatic pain versus less likely event shortness location. X-ray and ultrasound negative for any acute process. Anticipate likely soft tissue inflammation or muscle strain. Explained findings to patient recommend continue conservative management. Discharged with return precautions.           Vital Signs: Reviewed the patients vital signs.   Nursing Notes: Reviewed and utilized available nursing notes.  Medical Records Reviewed: Reviewed available past medical records.  Counseling: The emergency provider has spoken and discussed todays findings with the patient and/or caregiver, in addition to providing specific details for the plan of care.  Questions are answered and there is  agreement with the plan.        CARDIAC STUDIES   IMAGING   PULSE OXIMETRY      The following imagine studies were independently interpreted by me (emergency physician):    R hip Xray interpreted independently by Jerl Santos, MD (ED physician):  Findings/Impression: No fracture or dislocation      Radiology:  XR Hip  right 1 vw with pelvis   Final Result    No fracture or dislocation. Curvilinear metallic density   overlies the L5 level. It is of uncertain etiology.      Kinnie Feil, MD    09/08/2020 4:15 PM      US Venous Dopp Right Low Extrem   Final Result      1. No evidence of deep vein thrombosis in the right lower extremity.      Jeanette Caprice, MD    09/08/2020 2:17 PM          Oxygen Saturation by Pulse Oximetry: 99%  Interventions: none   Interpretation:  Normal     CRITICAL CARE/PROCEDURES    Procedures      EMERGENCY DEPT. MEDICATIONS      ED Medication Orders (From admission, onward)    Start Ordered     Status Ordering Provider    09/08/20 1333 09/08/20 1332  acetaminophen (TYLENOL) tablet 1,000 mg  Once        Route: Oral  Ordered Dose: 1,000 mg     Last MAR action: Given Yahel Fuston    09/08/20 1333 09/08/20 1332  ibuprofen (ADVIL) tablet 600 mg  Once        Route: Oral  Ordered Dose: 600 mg     Last MAR action: Given Zanaiya Calabria          LABORATORY RESULTS    Ordered and independently interpreted AVAILABLE laboratory tests. Please see results section in chart for full details.  Results for orders placed or performed during the hospital encounter of 09/08/20   Urine BHCG POC   Result Value Ref Range    Urine bHCG POC Positive (A) Negative   Urine BHCG POC   Result Value Ref Range    Urine bHCG POC Negative Negative       DIAGNOSIS      Diagnosis:  Final diagnoses:   Right thigh pain       Disposition:  ED Disposition     ED Disposition Condition Date/Time Comment    Discharge  Mon Sep 08, 2020  4:23 PM Daphene Jaeger discharge to home/self care.    Condition at disposition: Stable          Prescriptions:  Discharge Medication List as of 09/08/2020  4:23 PM      CONTINUE these medications which have NOT CHANGED    Details   hydrOXYzine (ATARAX) 25 MG tablet 12.5 mg to 25 mg as needed for anxiety up to 50 mg PRN for sleep., E-Rx      Multiple Vitamins-Minerals (multivitamin with minerals) tablet  Take 1 tablet by mouth daily "Womens", Historical Med      sertraline (ZOLOFT) 50 MG tablet Take 0.5 tablets (25 mg total) by mouth after breakfast Increase to 50 mg after 1 week., Starting Wed 07/23/2020, Until Tue 10/21/2020, E-Rx             This note was generated by the Epic EMR system and Dragon speech recognition and may contain inherent errors not intended by the user due to software limitations.  Not all errors are caught or corrected. If there are questions or concerns about the content of this note or information contained within the body of this dictation they should be addressed directly with the author for clarification.     Delorus Langwell, MD  09/11/20 1158

## 2021-05-11 ENCOUNTER — Emergency Department
Admission: EM | Admit: 2021-05-11 | Discharge: 2021-05-11 | Disposition: A | Discharge status: Home / Self Care | Payer: Self-pay | Attending: Emergency Medicine | Admitting: Emergency Medicine

## 2021-05-11 DIAGNOSIS — R110 Nausea: Secondary | ICD-10-CM | POA: Insufficient documentation

## 2021-05-11 DIAGNOSIS — B349 Viral infection, unspecified: Secondary | ICD-10-CM | POA: Insufficient documentation

## 2021-05-11 DIAGNOSIS — Z20822 Contact with and (suspected) exposure to covid-19: Secondary | ICD-10-CM | POA: Insufficient documentation

## 2021-05-11 LAB — URINE HCG QUALITATIVE: Urine HCG Qualitative: NEGATIVE

## 2021-05-11 LAB — COVID-19 (SARS-COV-2): SARS CoV-2: NEGATIVE

## 2021-05-11 MED ORDER — ONDANSETRON 4 MG PO TBDP
4.00 mg | ORAL_TABLET | Freq: Four times a day (QID) | ORAL | 0 refills | Status: AC | PRN
Start: 2021-05-11 — End: ?

## 2021-05-11 MED ORDER — ONDANSETRON 4 MG PO TBDP
4.00 mg | ORAL_TABLET | Freq: Once | ORAL | Status: AC
Start: 2021-05-11 — End: 2021-05-11
  Administered 2021-05-11: 4 mg via ORAL
  Filled 2021-05-11: qty 1

## 2021-05-11 NOTE — ED Provider Notes (Signed)
EMERGENCY DEPARTMENT HISTORY AND PHYSICAL EXAM     None        Date: 05/11/2021  Patient Name: Casey Yates    History of Presenting Illness     Chief Complaint   Patient presents with    Emesis    Cough    Diarrhea       History Provided By: Patient    Chief Complaint: nausea, cough  Duration: 4 days  Timing:  Gradual, Constant  Location: generalized/chest  Quality: uncomfortable  Severity: Moderate  Exacerbating factors: none   Alleviating factors: zofran improved nausea  Associated Symptoms: cough, runny nose, fatigue  Pertinent Negatives: fever, CP, SOB    Additional History: Casey Yates is a 35 y.o. female presenting to the ED with cough, runny nose, and nausea. Pt states there have been several covid cases in her workplace, but she had a negative test at home. Doubts pregnancy, but she is due for her Depo.     PCP: Pcp, None, MD  SPECIALISTS:    No current facility-administered medications for this encounter.     Current Outpatient Medications   Medication Sig Dispense Refill    hydrOXYzine (ATARAX) 25 MG tablet 12.5 mg to 25 mg as needed for anxiety up to 50 mg PRN for sleep. 60 tablet 2    Multiple Vitamins-Minerals (multivitamin with minerals) tablet Take 1 tablet by mouth daily "Womens"      ondansetron (ZOFRAN-ODT) 4 MG disintegrating tablet Take 1 tablet (4 mg total) by mouth every 6 (six) hours as needed for Nausea 8 tablet 0    sertraline (ZOLOFT) 50 MG tablet Take 0.5 tablets (25 mg total) by mouth after breakfast Increase to 50 mg after 1 week. 30 tablet 2       Past History     Past Medical History:  Past Medical History:   Diagnosis Date    Anxiety     Depression        Past Surgical History:  History reviewed. No pertinent surgical history.    Family History:  Family History   Problem Relation Age of Onset    Anxiety disorder Mother     Depression Mother     Alcohol abuse Mother     Drug abuse Mother     ADD / ADHD Son     ADD / ADHD Son     Anxiety disorder Niece     Depression Niece         Social History:  Social History     Tobacco Use    Smoking status: Never    Smokeless tobacco: Never   Substance Use Topics    Alcohol use: Yes     Comment: social    Drug use: Never       Allergies:  No Known Allergies    Review of Systems     Review of Systems   Constitutional:  Positive for fatigue. Negative for fever.   Respiratory:  Positive for cough.    Cardiovascular:  Negative for chest pain.   Gastrointestinal:  Positive for nausea. Negative for abdominal pain and vomiting.   Neurological:  Negative for syncope.     Physical Exam   BP 120/78   Pulse 81   Temp 98.3 F (36.8 C) (Oral)   Resp 18   Ht 5\' 1"  (1.549 m)   Wt 85.7 kg   SpO2 97%   BMI 35.71 kg/m     Physical Exam  Vitals and nursing  note reviewed.   Constitutional:       General: She is not in acute distress.     Appearance: She is well-developed.   HENT:      Head: Normocephalic and atraumatic.      Nose: Nose normal.      Mouth/Throat:      Mouth: Mucous membranes are moist.      Pharynx: Oropharynx is clear.   Eyes:      Extraocular Movements: Extraocular movements intact.   Cardiovascular:      Rate and Rhythm: Normal rate and regular rhythm.      Heart sounds: Normal heart sounds.   Pulmonary:      Effort: Pulmonary effort is normal.      Breath sounds: Normal breath sounds. No wheezing.   Abdominal:      Palpations: Abdomen is soft.      Tenderness: There is no abdominal tenderness.   Musculoskeletal:         General: Normal range of motion.      Cervical back: Normal range of motion and neck supple.   Skin:     General: Skin is warm and dry.      Capillary Refill: Capillary refill takes less than 2 seconds.   Neurological:      General: No focal deficit present.      Mental Status: She is alert and oriented to person, place, and time.   Psychiatric:         Mood and Affect: Mood normal.         Behavior: Behavior normal.       Diagnostic Study Results     Labs -     Results       Procedure Component Value Units Date/Time     COVID-19 (SARS-COV-2) [facility] Rapid) [[id number]] Collected: 05/11/21 1351    Specimen: Nasopharyngeal Swab from Nasopharynx Updated: 05/11/21 1420     Purpose of COVID testing Screening     SARS-CoV-2 Specimen Source Nasopharyngeal     SARS CoV-2 Negative    Narrative:      o Collect and clearly label specimen type:  o Upper respiratory specimen: One Nasopharyngeal Dry Swab NO  Transport Media.  o Hand deliver to laboratory ASAP  Indication for testing->Extended care facility admission to  semi private room  Screening            Radiologic Studies -   Radiology Results (24 Hour)       ** No results found for the last 24 hours. **        .    Medical Decision Making   I am the first provider for this patient.    I reviewed the vital signs, available nursing notes, past medical history, past surgical history, family history and social history.    Vital Signs-Reviewed the patient's vital signs.   Patient Vitals for the past 12 hrs:   BP Temp Pulse Resp   05/11/21 1339 120/78 98.3 F (36.8 C) 81 18       Pulse Oximetry Analysis - Normal 97% on RA    Old Medical Records: Old medical records.  Nursing notes.     ED Course:   2:55 PM - Signed out to Dr. [name], who will f/u on preg test, dispo. Pt aware of plan. All questions answered.      Provider Notes:   35 y.o. F with nausea, cough, congestion, and fatigue x 4-5 days. Pt states several coworkers have COVID.  Pt's test at home neg, today's test neg. Pt states unsure on pregnancy status--will send preg test. No CP, SOB, abd pain, fever. Suspect viral illness. Pt is well-appearing, able to tolerate PO. Will recommend supportive care, close f/u. All questions answered.      Dr. Ander Slade is the primary emergency physician of record.       Diagnosis     Clinical Impression:   1. Viral illness    2. Nausea        Treatment Plan:   ED Disposition       None            This note was generated by the Epic EMR system/ Dragon speech recognition and may contain inherent errors or  omissions not intended by the user. Grammatical errors, random word insertions, deletions and pronoun errors  are occasional consequences of this technology due to software limitations. Not all errors are caught or corrected. If there are questions or concerns about the content of this note or information contained within the body of this dictation they should be addressed directly with the author for clarification.    _______________________________

## 2021-05-11 NOTE — Discharge Instructions (Signed)
Your COVID test was negative today, but we recommend you continue testing every few days while you're not feeling well.

## 2021-05-11 NOTE — ED Triage Notes (Signed)
Pt reports cough, N/V/D, and headaches since 05/07/21, pt reports negative home COVID test on 05/08/21

## 2021-11-01 NOTE — L&D Delivery Note (Signed)
[facility]N [facility] Delivery Note  Theresa Bernard is a 36 y.o. [id number] s/p SVD at [redacted]w[redacted]d. She was admitted for IOL for A1 GDM.   ROM: 3h 4m with clear fluid GBS Status:  Negative/-- (08/29 0906) Maximum Maternal Temperature: afebrile  Labor Progress: Initial SVE: 1/50/-3. She then progressed to complete.   Delivery Date/Time:  Delivery: Called to room and patient was complete and pushing. Head delivered OA. No nuchal cord present. Shoulder and body delivered in usual fashion. Infant with spontaneous cry, placed on mother's abdomen, dried and stimulated. Cord clamped x 2 after 1-minute delay, and cut by FOB. Cord blood drawn. Placenta delivered spontaneously with gentle cord traction. Fundus firm with massage and Pitocin. Labia, perineum, vagina, and cervix inspected inspected with no lacerations.  Baby Weight: pending  Placenta: Sent to L&D Complications: None Lacerations: none EBL: 150 mL Analgesia: Epidural   Infant:  APGAR (1 MIN): 9   APGAR (5 MINS): 10   APGAR (10 MINS):     [place] for [place] 07/18/2022, 6:16 PM

## 2021-12-04 ENCOUNTER — Other Ambulatory Visit: Payer: Self-pay

## 2021-12-04 ENCOUNTER — Ambulatory Visit (INDEPENDENT_AMBULATORY_CARE_PROVIDER_SITE_OTHER): Payer: 59 | Admitting: *Deleted

## 2021-12-04 ENCOUNTER — Ambulatory Visit: Payer: 59

## 2021-12-04 DIAGNOSIS — N912 Amenorrhea, unspecified: Secondary | ICD-10-CM | POA: Diagnosis not present

## 2021-12-04 LAB — POCT URINE PREGNANCY: Preg Test, Ur: POSITIVE — AB

## 2021-12-04 NOTE — Progress Notes (Signed)
Agree with nurses's documentation of this patient's clinic encounter.  Ebelyn Bohnet L, MD  

## 2021-12-04 NOTE — Progress Notes (Signed)
Theresa Bernard presents today for UPT. She complains of slight nausea.  LMP:10/15/21 EDD: 07/22/22    OBJECTIVE: Appears well, in no apparent distress.  OB History   No obstetric history on file.    Home UPT Result:positive In-Office UPT result:positive  I have reviewed the patient's allergies and medications.   ASSESSMENT: Positive pregnancy test  PLAN Prenatal care to be completed at: [place] may take Vitamin B6 and Unisom for nausea - make office aware if no relief in symptoms.

## 2021-12-08 ENCOUNTER — Encounter: Payer: Self-pay | Admitting: Obstetrics and Gynecology

## 2021-12-10 ENCOUNTER — Other Ambulatory Visit: Payer: Self-pay | Admitting: Obstetrics

## 2021-12-10 MED ORDER — PROMETHAZINE HCL 25 MG PO TABS
25.0000 mg | ORAL_TABLET | Freq: Four times a day (QID) | ORAL | 0 refills | Status: DC | PRN
Start: 2021-12-10 — End: 2022-02-09

## 2021-12-18 ENCOUNTER — Ambulatory Visit: Payer: 59 | Admitting: Podiatry

## 2021-12-27 ENCOUNTER — Telehealth: Payer: 59

## 2021-12-27 ENCOUNTER — Telehealth: Payer: 59 | Admitting: Emergency Medicine

## 2021-12-27 DIAGNOSIS — J069 Acute upper respiratory infection, unspecified: Secondary | ICD-10-CM

## 2021-12-27 NOTE — Patient Instructions (Signed)
°  Despina Hick, thank you for joining Lestine Box, PA-C for today's virtual visit.  While this provider is not your primary care provider (PCP), if your PCP is located in our provider database this encounter information will be shared with them immediately following your visit.  Consent: (Patient) Theresa Bernard provided verbal consent for this virtual visit at the beginning of the encounter.  Current Medications:  Current Outpatient Medications:    promethazine (PHENERGAN) 25 MG tablet, Take 1 tablet (25 mg total) by mouth every 6 (six) hours as needed for nausea or vomiting., Disp: 30 tablet, Rfl: 0   Medications ordered in this encounter:  No orders of the defined types were placed in this encounter.    *If you need refills on other medications prior to your next appointment, please contact your pharmacy*  Follow-Up: Call back or seek an in-person evaluation if the symptoms worsen or if the condition fails to improve as anticipated.  Other Instructions Rest and push fluids Use OTC like benadryl, mucinex, and nasal spray for relief of cold symptoms Follow up with OB this week for recheck and ensure symptoms are improving   If you have been instructed to have an in-person evaluation today at a local Urgent Care facility, please use the link below. It will take you to a list of all of our available [facility], including address, phone number and hours of operation. Please do not delay care.  [facility]  If you or a family member do not have a primary care provider, use the link below to schedule a visit and establish care. When you choose a [facility] primary care physician or advanced practice provider, you gain a long-term partner in health. Find a Primary Care Provider  Learn more about [facility]'s in-office and virtual care options: [facility]

## 2021-12-27 NOTE — Progress Notes (Signed)
Virtual Visit Consent   Theresa Bernard, you are scheduled for a virtual visit with a [facility] provider today.     Just as with appointments in the office, your consent must be obtained to participate.  Your consent will be active for this visit and any virtual visit you may have with one of our providers in the next 365 days.     If you have a MyChart account, a copy of this consent can be sent to you electronically.  All virtual visits are billed to your insurance company just like a traditional visit in the office.    As this is a virtual visit, video technology does not allow for your provider to perform a traditional examination.  This may limit your provider's ability to fully assess your condition.  If your provider identifies any concerns that need to be evaluated in person or the need to arrange testing (such as labs, EKG, etc.), we will make arrangements to do so.     Although advances in technology are sophisticated, we cannot ensure that it will always work on either your end or our end.  If the connection with a video visit is poor, the visit may have to be switched to a telephone visit.  With either a video or telephone visit, we are not always able to ensure that we have a secure connection.     I need to obtain your verbal consent now.   Are you willing to proceed with your visit today? Yes   [name] has provided verbal consent on 12/27/2021 for a virtual visit (video or telephone).   Theresa Bernard, [place]   Date: 12/27/2021 2:08 PM   Virtual Visit via Video Note   I, Theresa Bernard, connected with  Monnica Saltsman  ([id number], 06-01-1986) on 12/27/21 at  2:00 PM EST by a video-enabled telemedicine application and verified that I am speaking with the correct person using two identifiers.  Location: Patient: Virtual Visit Location Patient: Home Provider: Virtual Visit Location Provider: Home Office   I discussed the limitations of evaluation and management by  telemedicine and the availability of in person appointments. The patient expressed understanding and agreed to proceed.    History of Present Illness: Theresa Bernard is a 36 y.o. who identifies as a female who was assigned female at birth, and is being seen today for runny nose, congestion, and cough x 3 weeks.  Denies sick contacts or exposure.  Has tried children's medication without relief.  Worse at work with wearing a mask.  Reports previous symptoms in the past with covid.  Denies fever, chills, fatigue, sinus pain/ pressure, sore throat, wheezing, SOB, CP, diarrhea.    HPI: HPI  Problems: There are no problems to display for this patient.   Allergies: No Known Allergies Medications:  Current Outpatient Medications:    promethazine (PHENERGAN) 25 MG tablet, Take 1 tablet (25 mg total) by mouth every 6 (six) hours as needed for nausea or vomiting., Disp: 30 tablet, Rfl: 0  Observations/Objective: Patient is well-developed, well-nourished in no acute distress.  Resting comfortably at home. Mildly fatigued appearing Head is normocephalic, atraumatic.  No labored breathing. Speaking in full sentences and tolerating own secretions Speech is clear and coherent with logical content.  Patient is alert and oriented at baseline.   Assessment and Plan: 1. Viral URI with cough  Rest and push fluids Use OTC like benadryl, mucinex, and nasal spray for relief of cold symptoms Follow up with OB this week  for recheck and ensure symptoms are improving  Follow Up Instructions: I discussed the assessment and treatment plan with the patient. The patient was provided an opportunity to ask questions and all were answered. The patient agreed with the plan and demonstrated an understanding of the instructions.  A copy of instructions were sent to the patient via MyChart unless otherwise noted below.   The patient was advised to call back or seek an in-person evaluation if the symptoms worsen or if the  condition fails to improve as anticipated.  Time:  I spent 5-10 minutes with the patient via telehealth technology discussing the above problems/concerns.    Theresa Harding, PA-C

## 2022-01-08 ENCOUNTER — Other Ambulatory Visit: Payer: Self-pay

## 2022-01-08 ENCOUNTER — Ambulatory Visit (INDEPENDENT_AMBULATORY_CARE_PROVIDER_SITE_OTHER): Payer: 59 | Admitting: Certified Nurse Midwife

## 2022-01-08 ENCOUNTER — Other Ambulatory Visit (HOSPITAL_COMMUNITY)
Admission: RE | Admit: 2022-01-08 | Discharge: 2022-01-08 | Disposition: A | Discharge status: Home / Self Care | Payer: 59 | Source: Ambulatory Visit | Attending: Certified Nurse Midwife | Admitting: Certified Nurse Midwife

## 2022-01-08 ENCOUNTER — Ambulatory Visit (INDEPENDENT_AMBULATORY_CARE_PROVIDER_SITE_OTHER): Payer: 59

## 2022-01-08 VITALS — BP 133/72 | HR 96 | Ht 61.0 in | Wt 202.6 lb

## 2022-01-08 DIAGNOSIS — O0991 Supervision of high risk pregnancy, unspecified, first trimester: Secondary | ICD-10-CM | POA: Diagnosis present

## 2022-01-08 DIAGNOSIS — O3680X0 Pregnancy with inconclusive fetal viability, not applicable or unspecified: Secondary | ICD-10-CM

## 2022-01-08 DIAGNOSIS — Z3491 Encounter for supervision of normal pregnancy, unspecified, first trimester: Secondary | ICD-10-CM | POA: Insufficient documentation

## 2022-01-08 DIAGNOSIS — Z3A12 12 weeks gestation of pregnancy: Secondary | ICD-10-CM | POA: Diagnosis not present

## 2022-01-08 DIAGNOSIS — O09521 Supervision of elderly multigravida, first trimester: Secondary | ICD-10-CM

## 2022-01-08 DIAGNOSIS — Z3480 Encounter for supervision of other normal pregnancy, unspecified trimester: Secondary | ICD-10-CM | POA: Insufficient documentation

## 2022-01-08 DIAGNOSIS — O219 Vomiting of pregnancy, unspecified: Secondary | ICD-10-CM | POA: Diagnosis not present

## 2022-01-08 MED ORDER — BLOOD PRESSURE KIT DEVI: 1.0000 | 0 refills | Status: DC

## 2022-01-08 NOTE — Progress Notes (Signed)
Patient presents for NOB. 

## 2022-01-08 NOTE — Progress Notes (Signed)
New OB Intake ? ?I connected with  Theresa Bernard on 01/08/22 at  8:15 AM EST by in person and verified that I am speaking with the correct person using two identifiers. Nurse is located at [facility] and pt is located at [place]. ? ?I discussed the limitations, risks, security and privacy concerns of performing an evaluation and management service by telephone and the availability of in person appointments. I also discussed with the patient that there may be a patient responsible charge related to this service. The patient expressed understanding and agreed to proceed. ? ?I explained I am completing New OB Intake today. We discussed her EDD of 07/22/22 that is based on LMP of 10/15/21. Pt is G7/P5015. I reviewed her allergies, medications, Medical/Surgical/OB history, and appropriate screenings. I informed her of [facility] services. Based on history, this is a/an  pregnancy uncomplicated .  ? ?There are no problems to display for this patient. ? ? ?Concerns addressed today ? ?Delivery Plans:  ?Plans to deliver at [facility].  ? ?Waterbirth candidate?  ? ?MyChart/Babyscripts ?MyChart access verified. I explained pt will have some visits in office and some virtually. Babyscripts instructions given and order placed. Patient verifies receipt of registration text/e-mail. Account successfully created and app downloaded. ? ?Blood Pressure Cuff  ?Blood pressure cuff ordered for patient to pick-up from [employer]. Explained after first prenatal appt pt will check weekly and document in Babyscripts. ? ?Weight scale: Patient does have weight scale. ?Anatomy US ?Explained first scheduled [place] will be around 19 weeks. Dating and viability scan performed today. ? ?Labs ?Discussed Avelina Laine genetic screening with patient. Would like both Panorama and Horizon drawn at new OB visit.Also if interested in genetic testing, tell patient she will need AFP 15-21 weeks to complete genetic testing .Routine prenatal labs needed. ? ?Covid  Vaccine ?Patient has covid vaccine.  ? ? ?Informed patient of [facility] website  and placed link in her AVS.  ? ?Social Determinants of Health ?Food Insecurity: Patient denies food insecurity. ?WIC Referral: Patient is interested in referral to [facility].  ?Transportation: Patient denies transportation needs. ?Childcare: Discussed no children allowed at ultrasound appointments. Offered childcare services; patient declines childcare services at this time. ? ?Send link to Pregnancy Navigators ? ? ?Placed OB Box on problem list and updated ? ?First visit review ?I reviewed new OB appt with pt. I explained she will have a pelvic exam, ob bloodwork with genetic screening, and PAP smear. Explained pt will be seen by Edd Arbour at first visit; encounter routed to appropriate provider. Explained that patient will be seen by pregnancy navigator following visit with provider. [facility] information placed in AVS.  ? ?Hamilton Capri, RN ?01/08/2022  8:25 AM  ?

## 2022-01-08 NOTE — Progress Notes (Signed)
? ?History:  ? Theresa Bernard is a 36 y.o. [id number] at 37w1dby LMP being seen today for her first obstetrical visit.  Her obstetrical history is significant for advanced maternal age. Patient does intend to breast feed. Pregnancy history fully reviewed. ? ?Patient reports no complaints. Nausea has gotten much better, averaging phenergan use about once a week. Desires a BTL after this baby is born.  ?  ?HISTORY: ?OB History  ?Gravida Para Term Preterm AB Living  ?_0 0 1 5  ?SAB IAB Ectopic Multiple Live Births  ?0 1 0 0 5  ?  ?# Outcome Date GA Lbr Len/2nd Weight Sex Delivery Anes PTL Lv  ?7 Current           ?6 Term 03/15/19 468w0d M Vag-Spont   LIV  ?5 Term 11/28/16 4137w0dM Vag-Spont   LIV  ?4 Term 01/18/12 41w110w0d Vag-Spont   LIV  ?3 IAB 2012          ?2 Term 10/10/06 37w444w4dVag-Spont   LIV  ?1 Term 08/28/05 21w2d64w2dag-Spont   LIV  ?  ?Last pap smear was done 2018 and was normal ? ?Past Medical History:  ?Diagnosis Date  ? Anxiety   ? Depression   ? ?Past Surgical History:  ?Procedure Laterality Date  ? TONSILLECTOMY  1991  ? ?Family History  ?Problem Relation Age of Onset  ? Heart murmur Mother   ? COPD Maternal Grandmother   ? Stomach cancer Maternal Grandfather   ? ?Social History  ? ?Tobacco Use  ? Smoking status: Never  ? Smokeless tobacco: Never  ?Vaping Use  ? Vaping Use: Never used  ?Substance Use Topics  ? Alcohol use: Yes  ?  Comment: 1 x per week prior to pregnancy  ? Drug use: Never  ? ?No Known Allergies ?Current Outpatient Medications on File Prior to Visit  ?Medication Sig Dispense Refill  ? Blood Pressure Monitoring (BLOOD PRESSURE KIT) DEVI 1 kit by Does not apply route once a week. 1 each 0  ? Prenatal MV & Min w/FA-DHA (PRENATAL GUMMIES PO) Take by mouth.    ? promethazine (PHENERGAN) 25 MG tablet Take 1 tablet (25 mg total) by mouth every 6 (six) hours as needed for nausea or vomiting. 30 tablet 0  ? ?No current facility-administered medications on file prior to visit.   ? ? ?Review of Systems ?Pertinent items noted in HPI and remainder of comprehensive ROS otherwise negative. ?Physical Exam:  ?See RN intake note for vitals and FHT. ? ?Constitutional: Well-developed, well-nourished pregnant female in no acute distress.  ?HEENT: PERRLA ?Skin: normal color and turgor, no rash ?Cardiovascular: normal rate & rhythm ?Respiratory: normal effort ?GI: Abd soft, non-tender, pos BS x 4, gravid appropriate for gestational age ?MS: Extremities nontender, no edema, normal ROM ?Neurologic: Alert and oriented x 4.  ?GU: no CVA tenderness ?Pelvic: NEFG, physiologic discharge, no blood, cervix clean. Pap/swabs collected ? ?Assessment & Plan:  ?1. Supervision of high risk pregnancy in first trimester ?- Doing well overall, not feeling fetal movement yet ?- Cytology - PAP( [place]) ?- Cervicovaginal ancillary only( [place]) ?- CBC/D/Plt+RPR+Rh+ABO+RubIgG... ?- Genetic Screening ?- Culture, OB Urine ? ?2. [redacted] weeks gestation of pregnancy ?- Routine OB care ?- Suggest AFP at next visit ? ?3. Multigravida of advanced maternal age in first trimester ?- To begin taking bASA ? ?4. Vomiting or nausea of pregnancy ?- Largely  resolved ? ?5. Initial obstetric visit in first trimester ?- Initial labs drawn. ?- Continue prenatal vitamins. ?- Problem list reviewed and updated. ?- Genetic Screening discussed, First trimester screen, Quad screen, and NIPS: ordered. ?- Ultrasound discussed; fetal anatomic survey: ordered. ?- Anticipatory guidance about prenatal visits given including labs, ultrasounds, and testing. ?- Discussed usage of Babyscripts and virtual visits as additional source of managing and completing prenatal visits in midst of coronavirus and pandemic.   ?- Encouraged to complete MyChart Registration for her ability to review results, send requests, and have questions addressed.  ?- The nature of [place] for [facility] with multiple MDs and Advanced  Practice Providers was explained to patient; also emphasized that residents, students are part of our team. ?- Routine obstetric precautions reviewed. Encouraged to seek out care at office or emergency room [facility] preferred) for urgent and/or emergent concerns. ? ?Return in about 4 weeks (around 02/05/2022) for IN-PERSON, LOB.  ?  ?Gaylan Gerold, MSN, CNM, IBCLC ?Certified Nurse Midwife, [place] ? ? ?

## 2022-01-09 LAB — CBC/D/PLT+RPR+RH+ABO+RUBIGG...
Antibody Screen: NEGATIVE
Basophils Absolute: 0 10*3/uL (ref 0.0–0.2)
Basos: 0 %
EOS (ABSOLUTE): 0.1 10*3/uL (ref 0.0–0.4)
Eos: 1 %
HCV Ab: NONREACTIVE
HIV Screen 4th Generation wRfx: NONREACTIVE
Hematocrit: 33.7 % — ABNORMAL LOW (ref 34.0–46.6)
Hemoglobin: 11.1 g/dL (ref 11.1–15.9)
Hepatitis B Surface Ag: NEGATIVE
Immature Grans (Abs): 0 10*3/uL (ref 0.0–0.1)
Immature Granulocytes: 0 %
Lymphocytes Absolute: 2 10*3/uL (ref 0.7–3.1)
Lymphs: 24 %
MCH: 29 pg (ref 26.6–33.0)
MCHC: 32.9 g/dL (ref 31.5–35.7)
MCV: 88 fL (ref 79–97)
Monocytes Absolute: 0.5 10*3/uL (ref 0.1–0.9)
Monocytes: 6 %
Neutrophils Absolute: 5.6 10*3/uL (ref 1.4–7.0)
Neutrophils: 69 %
Platelets: 266 10*3/uL (ref 150–450)
RBC: 3.83 x10E6/uL (ref 3.77–5.28)
RDW: 12.9 % (ref 11.7–15.4)
RPR Ser Ql: NONREACTIVE
Rh Factor: POSITIVE
Rubella Antibodies, IGG: 5.82 index (ref >0.99)
WBC: 8.1 10*3/uL (ref 3.4–10.8)

## 2022-01-09 LAB — HCV INTERPRETATION

## 2022-01-10 LAB — CULTURE, OB URINE

## 2022-01-10 LAB — URINE CULTURE, OB REFLEX

## 2022-01-11 ENCOUNTER — Other Ambulatory Visit: Payer: Self-pay | Admitting: Certified Nurse Midwife

## 2022-01-11 ENCOUNTER — Encounter: Payer: Self-pay | Admitting: Certified Nurse Midwife

## 2022-01-11 LAB — CERVICOVAGINAL ANCILLARY ONLY
Bacterial Vaginitis (gardnerella): POSITIVE — AB
Candida Glabrata: NEGATIVE
Candida Vaginitis: NEGATIVE
Chlamydia: NEGATIVE
Comment: NEGATIVE
Comment: NEGATIVE
Comment: NEGATIVE
Comment: NEGATIVE
Comment: NEGATIVE
Comment: NORMAL
Neisseria Gonorrhea: NEGATIVE
Trichomonas: NEGATIVE

## 2022-01-11 MED ORDER — METRONIDAZOLE 0.75 % VA GEL: 1.0000 | Freq: Every day | VAGINAL | 0 refills | Status: AC

## 2022-01-12 ENCOUNTER — Encounter: Payer: Self-pay | Admitting: Certified Nurse Midwife

## 2022-01-12 LAB — CYTOLOGY - PAP
Comment: NEGATIVE
Diagnosis: NEGATIVE
High risk HPV: POSITIVE — AB

## 2022-01-13 ENCOUNTER — Encounter: Payer: Self-pay | Admitting: Certified Nurse Midwife

## 2022-01-19 ENCOUNTER — Encounter: Payer: 59 | Admitting: Obstetrics and Gynecology

## 2022-01-20 ENCOUNTER — Encounter: Payer: Self-pay | Admitting: Certified Nurse Midwife

## 2022-01-21 ENCOUNTER — Encounter: Payer: Self-pay | Admitting: Obstetrics and Gynecology

## 2022-01-22 ENCOUNTER — Encounter: Payer: Self-pay | Admitting: Obstetrics and Gynecology

## 2022-02-08 NOTE — Progress Notes (Signed)
? ?  PRENATAL VISIT NOTE ? ?Subjective:  ?Theresa Bernard is a 36 y.o. [id number] at [redacted]w[redacted]d being seen today for ongoing prenatal care.  She is currently monitored for the following issues for this low-risk pregnancy and has Supervision of other normal pregnancy, antepartum on their problem list. ? ?Patient reports  groin discomfort .  Contractions: Not present. Vag. Bleeding: None.  Movement: Present (flutters). Denies leaking of fluid.  ? ?The following portions of the patient's history were reviewed and updated as appropriate: allergies, current medications, past family history, past medical history, past social history, past surgical history and problem list.  ? ?Objective:  ? ?Vitals:  ? 02/09/22 1331  ?BP: 116/68  ?Pulse: 83  ?Weight: 205 lb (93 kg)  ? ? ?Fetal Status:     Movement: Present    ? ?General:  Alert, oriented and cooperative. Patient is in no acute distress.  ?Skin: Skin is warm and dry. No rash noted.   ?Cardiovascular: Normal heart rate noted  ?Respiratory: Normal respiratory effort, no problems with respiration noted  ?Abdomen: Soft, gravid, appropriate for gestational age.  Pain/Pressure: Present     ?Pelvic: Cervical exam deferred        ?Extremities: Normal range of motion.  Edema: None  ?Mental Status: Normal mood and affect. Normal behavior. Normal judgment and thought content.  ? ?Assessment and Plan:  ?Pregnancy: U2P5361 at [redacted]w[redacted]d ?1. Supervision of other normal pregnancy, antepartum ?- Offered and recommended AFP - pt accepts ?- Anatomy US ordered - detailed for age.  ?- Normal NIPS and horizon.  ?- Discussed pap smear - normal but HPV pos - recommend repap in one year. She is s/p gardasil vaccine.  ?- Discussed PPTL vs L/S salpingectomy and pros/cons of each. She will consider.  ? ?Preterm labor symptoms and general obstetric precautions including but not limited to vaginal bleeding, contractions, leaking of fluid and fetal movement were reviewed in detail with the patient. ?Please refer to  After Visit Summary for other counseling recommendations.  ? ?Return in about 4 weeks (around 03/09/2022) for OB VISIT, MD or APP, Schedule [place] with MFM. ? ?No future appointments. ? ? ?Milas Hock, MD ?

## 2022-02-09 ENCOUNTER — Ambulatory Visit (INDEPENDENT_AMBULATORY_CARE_PROVIDER_SITE_OTHER): Payer: 59 | Admitting: Obstetrics and Gynecology

## 2022-02-09 ENCOUNTER — Encounter: Payer: 59 | Admitting: Obstetrics and Gynecology

## 2022-02-09 VITALS — BP 116/68 | HR 83 | Wt 205.0 lb

## 2022-02-09 DIAGNOSIS — Z3480 Encounter for supervision of other normal pregnancy, unspecified trimester: Secondary | ICD-10-CM

## 2022-02-09 MED ORDER — PROMETHAZINE HCL 25 MG PO TABS: 25.0000 mg | ORAL_TABLET | Freq: Four times a day (QID) | ORAL | 0 refills | Status: DC | PRN

## 2022-02-09 NOTE — Addendum Note (Signed)
Addended by: Milas Hock A on: 02/09/2022 02:10 PM ? ? Modules accepted: Orders ? ?

## 2022-02-09 NOTE — Progress Notes (Signed)
ROB 16.5 wks ?Reports cont'd nausea, request RX, out of refills phenergan. ?Reports right side groin pain, advised maternity support belt. ?

## 2022-02-11 LAB — AFP, SERUM, OPEN SPINA BIFIDA
AFP MoM: 0.82
AFP Value: 26 ng/mL
Gest. Age on Collection Date: 16.5 weeks
Maternal Age At EDD: 36.2 yr
OSBR Risk 1 IN: 10000
Test Results:: NEGATIVE
Weight: 222 [lb_av]

## 2022-02-16 ENCOUNTER — Ambulatory Visit: Payer: 59 | Admitting: Podiatry

## 2022-02-16 ENCOUNTER — Encounter: Payer: Self-pay | Admitting: Obstetrics and Gynecology

## 2022-03-01 ENCOUNTER — Encounter: Payer: Self-pay | Admitting: Obstetrics and Gynecology

## 2022-03-02 ENCOUNTER — Ambulatory Visit: Payer: 59 | Attending: Obstetrics and Gynecology

## 2022-03-02 ENCOUNTER — Other Ambulatory Visit: Payer: Self-pay | Admitting: *Deleted

## 2022-03-02 ENCOUNTER — Encounter: Payer: Self-pay | Admitting: *Deleted

## 2022-03-02 ENCOUNTER — Ambulatory Visit: Payer: 59 | Admitting: *Deleted

## 2022-03-02 VITALS — BP 118/65 | HR 88

## 2022-03-02 DIAGNOSIS — Z3480 Encounter for supervision of other normal pregnancy, unspecified trimester: Secondary | ICD-10-CM

## 2022-03-02 DIAGNOSIS — Z363 Encounter for antenatal screening for malformations: Secondary | ICD-10-CM | POA: Diagnosis present

## 2022-03-02 DIAGNOSIS — O321XX0 Maternal care for breech presentation, not applicable or unspecified: Secondary | ICD-10-CM | POA: Insufficient documentation

## 2022-03-02 DIAGNOSIS — Z3A19 19 weeks gestation of pregnancy: Secondary | ICD-10-CM | POA: Diagnosis not present

## 2022-03-02 DIAGNOSIS — O09522 Supervision of elderly multigravida, second trimester: Secondary | ICD-10-CM

## 2022-03-02 DIAGNOSIS — Z6838 Body mass index (BMI) 38.0-38.9, adult: Secondary | ICD-10-CM

## 2022-03-02 DIAGNOSIS — O99212 Obesity complicating pregnancy, second trimester: Secondary | ICD-10-CM | POA: Diagnosis not present

## 2022-03-09 ENCOUNTER — Ambulatory Visit (INDEPENDENT_AMBULATORY_CARE_PROVIDER_SITE_OTHER): Payer: 59 | Admitting: Advanced Practice Midwife

## 2022-03-09 VITALS — BP 125/73 | HR 86 | Wt 206.6 lb

## 2022-03-09 DIAGNOSIS — O99212 Obesity complicating pregnancy, second trimester: Secondary | ICD-10-CM

## 2022-03-09 DIAGNOSIS — O09522 Supervision of elderly multigravida, second trimester: Secondary | ICD-10-CM

## 2022-03-09 DIAGNOSIS — Z3480 Encounter for supervision of other normal pregnancy, unspecified trimester: Secondary | ICD-10-CM

## 2022-03-09 DIAGNOSIS — Z3A20 20 weeks gestation of pregnancy: Secondary | ICD-10-CM

## 2022-03-09 NOTE — Progress Notes (Signed)
? ?  PRENATAL VISIT NOTE ? ?Subjective:  ?Theresa Bernard is a 36 y.o. U7O5366 at [redacted]w[redacted]d being seen today for ongoing prenatal care.  She is currently monitored for the following issues for this low-risk pregnancy and has Supervision of other normal pregnancy, antepartum on their problem list. ? ?Patient reports no complaints.  Contractions: Not present. Vag. Bleeding: None.  Movement: Present. Denies leaking of fluid.  ? ?The following portions of the patient's history were reviewed and updated as appropriate: allergies, current medications, past family history, past medical history, past social history, past surgical history and problem list.  ? ?Objective:  ? ?Vitals:  ? 03/09/22 0819  ?BP: 125/73  ?Pulse: 86  ?Weight: 206 lb 9.6 oz (93.7 kg)  ? ? ?Fetal Status: Fetal Heart Rate (bpm): 151 Fundal Height: 20 cm Movement: Present    ? ?General:  Alert, oriented and cooperative. Patient is in no acute distress.  ?Skin: Skin is warm and dry. No rash noted.   ?Cardiovascular: Normal heart rate noted  ?Respiratory: Normal respiratory effort, no problems with respiration noted  ?Abdomen: Soft, gravid, appropriate for gestational age.  Pain/Pressure: Absent     ?Pelvic: Cervical exam deferred        ?Extremities: Normal range of motion.  Edema: None  ?Mental Status: Normal mood and affect. Normal behavior. Normal judgment and thought content.  ? ?Assessment and Plan:  ?Pregnancy: [id number] at [redacted]w[redacted]d ?1. Supervision of other normal pregnancy, antepartum ?--Anticipatory guidance about next visits/weeks of pregnancy given.  ?--Hx vaginal del x 5, largest baby 9+ lbs ? ?2. [redacted] weeks gestation of pregnancy ? ? ?3. Multigravida of advanced maternal age in second trimester ?--Taking BASA daily ?--Normal NIPS testing ? ?4. Obesity affecting pregnancy in second trimester ?  ? ?Preterm labor symptoms and general obstetric precautions including but not limited to vaginal bleeding, contractions, leaking of fluid and fetal movement were  reviewed in detail with the patient. ?Please refer to After Visit Summary for other counseling recommendations.  ? ?Return in about 4 weeks (around 04/06/2022) for LOB, Any provider. ? ?Future Appointments  ?Date Time Provider Department Center  ?04/06/2022  8:15 AM [facility] NURSE [facility]  ?04/06/2022  8:30 AM [facility] US2 [facility]  ?04/13/2022  8:35 AM Brock Bad, MD [facility] None  ? ? ?Sharen Counter, CNM  ?

## 2022-03-09 NOTE — Progress Notes (Signed)
Pt presents for ROB No complaints today. 

## 2022-04-06 ENCOUNTER — Ambulatory Visit: Payer: 59 | Admitting: *Deleted

## 2022-04-06 ENCOUNTER — Other Ambulatory Visit: Payer: Self-pay | Admitting: *Deleted

## 2022-04-06 ENCOUNTER — Encounter: Payer: Self-pay | Admitting: *Deleted

## 2022-04-06 ENCOUNTER — Ambulatory Visit: Payer: 59 | Attending: Maternal & Fetal Medicine

## 2022-04-06 VITALS — BP 96/49 | HR 84

## 2022-04-06 DIAGNOSIS — O09522 Supervision of elderly multigravida, second trimester: Secondary | ICD-10-CM

## 2022-04-06 DIAGNOSIS — A630 Anogenital (venereal) warts: Secondary | ICD-10-CM | POA: Diagnosis not present

## 2022-04-06 DIAGNOSIS — O09292 Supervision of pregnancy with other poor reproductive or obstetric history, second trimester: Secondary | ICD-10-CM

## 2022-04-06 DIAGNOSIS — Z3A24 24 weeks gestation of pregnancy: Secondary | ICD-10-CM | POA: Diagnosis not present

## 2022-04-06 DIAGNOSIS — Z362 Encounter for other antenatal screening follow-up: Secondary | ICD-10-CM | POA: Insufficient documentation

## 2022-04-06 DIAGNOSIS — O98312 Other infections with a predominantly sexual mode of transmission complicating pregnancy, second trimester: Secondary | ICD-10-CM

## 2022-04-06 DIAGNOSIS — O99212 Obesity complicating pregnancy, second trimester: Secondary | ICD-10-CM | POA: Diagnosis not present

## 2022-04-06 DIAGNOSIS — Z6838 Body mass index (BMI) 38.0-38.9, adult: Secondary | ICD-10-CM | POA: Insufficient documentation

## 2022-04-06 DIAGNOSIS — E669 Obesity, unspecified: Secondary | ICD-10-CM

## 2022-04-13 ENCOUNTER — Other Ambulatory Visit: Payer: 59

## 2022-04-13 ENCOUNTER — Ambulatory Visit: Payer: 59

## 2022-04-13 ENCOUNTER — Encounter: Payer: 59 | Admitting: Obstetrics

## 2022-04-14 ENCOUNTER — Other Ambulatory Visit: Payer: 59

## 2022-04-20 ENCOUNTER — Other Ambulatory Visit: Payer: 59

## 2022-04-20 ENCOUNTER — Ambulatory Visit: Payer: 59

## 2022-04-29 ENCOUNTER — Encounter: Payer: Self-pay | Admitting: Obstetrics and Gynecology

## 2022-04-30 ENCOUNTER — Encounter: Payer: Self-pay | Admitting: Certified Nurse Midwife

## 2022-05-07 ENCOUNTER — Encounter: Payer: Self-pay | Admitting: Obstetrics & Gynecology

## 2022-05-07 ENCOUNTER — Other Ambulatory Visit: Payer: 59

## 2022-05-07 ENCOUNTER — Ambulatory Visit (INDEPENDENT_AMBULATORY_CARE_PROVIDER_SITE_OTHER): Payer: 59 | Admitting: Obstetrics & Gynecology

## 2022-05-07 DIAGNOSIS — O09529 Supervision of elderly multigravida, unspecified trimester: Secondary | ICD-10-CM | POA: Insufficient documentation

## 2022-05-07 DIAGNOSIS — Z23 Encounter for immunization: Secondary | ICD-10-CM

## 2022-05-07 DIAGNOSIS — O09523 Supervision of elderly multigravida, third trimester: Secondary | ICD-10-CM

## 2022-05-07 DIAGNOSIS — Z3483 Encounter for supervision of other normal pregnancy, third trimester: Secondary | ICD-10-CM

## 2022-05-07 DIAGNOSIS — Z3480 Encounter for supervision of other normal pregnancy, unspecified trimester: Secondary | ICD-10-CM

## 2022-05-07 DIAGNOSIS — Z3A29 29 weeks gestation of pregnancy: Secondary | ICD-10-CM

## 2022-05-07 DIAGNOSIS — O24419 Gestational diabetes mellitus in pregnancy, unspecified control: Secondary | ICD-10-CM

## 2022-05-07 NOTE — Progress Notes (Signed)
Pt presents for ROB visit. No concerns at this time.  

## 2022-05-07 NOTE — Progress Notes (Signed)
   PRENATAL VISIT NOTE  Subjective:  Theresa Bernard is a 36 y.o. K2I0973 at [redacted]w[redacted]d being seen today for ongoing prenatal care.  She is currently monitored for the following issues for this high-risk pregnancy and has Supervision of other normal pregnancy, antepartum and AMA (advanced maternal age) multigravida 35+ on their problem list.  Patient reports no complaints.  Contractions: Not present. Vag. Bleeding: None.  Movement: Present. Denies leaking of fluid.   The following portions of the patient's history were reviewed and updated as appropriate: allergies, current medications, past family history, past medical history, past social history, past surgical history and problem list.   Objective:   Vitals:   05/07/22 0834  BP: 119/72  Pulse: 90  Weight: 213 lb 12.8 oz (97 kg)    Fetal Status: Fetal Heart Rate (bpm): 144   Movement: Present     General:  Alert, oriented and cooperative. Patient is in no acute distress.  Skin: Skin is warm and dry. No rash noted.   Cardiovascular: Normal heart rate noted  Respiratory: Normal respiratory effort, no problems with respiration noted  Abdomen: Soft, gravid, appropriate for gestational age.  Pain/Pressure: Absent     Pelvic: Cervical exam deferred        Extremities: Normal range of motion.  Edema: Trace  Mental Status: Normal mood and affect. Normal behavior. Normal judgment and thought content.   Assessment and Plan:  Pregnancy: [id number] at [redacted]w[redacted]d 1. Supervision of other normal pregnancy, antepartum 28 week labs - Glucose Tolerance, 2 Hours w/1 Hour - CBC - HIV antibody (with reflex) - RPR - Tdap vaccine greater than or equal to 7yo IM  2. Multigravida of advanced maternal age in third trimester 36 y.o.   Preterm labor symptoms and general obstetric precautions including but not limited to vaginal bleeding, contractions, leaking of fluid and fetal movement were reviewed in detail with the patient. Please refer to After Visit  Summary for other counseling recommendations.   Return in about 2 weeks (around 05/21/2022).  Future Appointments  Date Time Provider Department Center  05/07/2022 11:15 AM Adam Phenix, MD [facility] None  06/01/2022  7:45 AM [facility] NURSE [facility]  06/01/2022  8:00 AM [facility] US1 [facility]    Scheryl Darter, MD

## 2022-05-08 LAB — GLUCOSE TOLERANCE, 2 HOURS W/ 1HR
Glucose, 1 hour: 184 mg/dL — ABNORMAL HIGH (ref 70–179)
Glucose, 2 hour: 127 mg/dL (ref 70–152)
Glucose, Fasting: 92 mg/dL — ABNORMAL HIGH (ref 70–91)

## 2022-05-08 LAB — CBC
Hematocrit: 29.5 % — ABNORMAL LOW (ref 34.0–46.6)
Hemoglobin: 9.7 g/dL — ABNORMAL LOW (ref 11.1–15.9)
MCH: 28.5 pg (ref 26.6–33.0)
MCHC: 32.9 g/dL (ref 31.5–35.7)
MCV: 87 fL (ref 79–97)
Platelets: 201 10*3/uL (ref 150–450)
RBC: 3.4 x10E6/uL — ABNORMAL LOW (ref 3.77–5.28)
RDW: 13.3 % (ref 11.7–15.4)
WBC: 10.2 10*3/uL (ref 3.4–10.8)

## 2022-05-08 LAB — HIV ANTIBODY (ROUTINE TESTING W REFLEX): HIV Screen 4th Generation wRfx: NONREACTIVE

## 2022-05-08 LAB — RPR: RPR Ser Ql: NONREACTIVE

## 2022-05-10 ENCOUNTER — Encounter: Payer: Self-pay | Admitting: Obstetrics & Gynecology

## 2022-05-10 ENCOUNTER — Encounter: Payer: Self-pay | Admitting: Certified Nurse Midwife

## 2022-05-18 ENCOUNTER — Telehealth: Payer: Self-pay | Admitting: Emergency Medicine

## 2022-05-18 NOTE — Telephone Encounter (Signed)
Attempted TC to discuss results. Sent [employer].

## 2022-05-19 DIAGNOSIS — O24410 Gestational diabetes mellitus in pregnancy, diet controlled: Secondary | ICD-10-CM | POA: Insufficient documentation

## 2022-05-19 DIAGNOSIS — O24419 Gestational diabetes mellitus in pregnancy, unspecified control: Secondary | ICD-10-CM | POA: Insufficient documentation

## 2022-05-19 NOTE — Addendum Note (Signed)
Addended by: Adam Phenix on: 05/19/2022 09:49 PM   Modules accepted: Orders

## 2022-05-20 ENCOUNTER — Telehealth: Payer: Self-pay | Admitting: Emergency Medicine

## 2022-05-20 DIAGNOSIS — Z3480 Encounter for supervision of other normal pregnancy, unspecified trimester: Secondary | ICD-10-CM

## 2022-05-20 DIAGNOSIS — O24419 Gestational diabetes mellitus in pregnancy, unspecified control: Secondary | ICD-10-CM

## 2022-05-20 MED ORDER — ACCU-CHEK GUIDE ME W/DEVICE KIT: 1.0000 | PACK | Freq: Four times a day (QID) | 0 refills | Status: DC

## 2022-05-20 MED ORDER — FERROUS SULFATE 325 (65 FE) MG PO TABS: 325.0000 mg | ORAL_TABLET | Freq: Two times a day (BID) | ORAL | 1 refills | Status: DC

## 2022-05-20 MED ORDER — ACCU-CHEK SOFTCLIX LANCETS MISC: 12 refills | Status: DC

## 2022-05-20 MED ORDER — ACCU-CHEK GUIDE VI STRP: ORAL_STRIP | 12 refills | Status: DC

## 2022-05-20 NOTE — Telephone Encounter (Signed)
TC to patient tod discuss GDM status, GDM education class and supplies sent to pharmacy.  Pt also has questions about hgb level of 9.7.   Following consult with Vonita Moss, NP, pt may start oral iron.   Supplies and iron sent to [facility] pharmacy at patient's request.

## 2022-05-21 NOTE — Telephone Encounter (Signed)
Erroneous encounter

## 2022-05-26 ENCOUNTER — Other Ambulatory Visit: Payer: Self-pay | Admitting: Emergency Medicine

## 2022-05-26 ENCOUNTER — Ambulatory Visit (INDEPENDENT_AMBULATORY_CARE_PROVIDER_SITE_OTHER): Payer: 59 | Admitting: Student

## 2022-05-26 VITALS — BP 122/76 | HR 100 | Wt 217.8 lb

## 2022-05-26 DIAGNOSIS — O24419 Gestational diabetes mellitus in pregnancy, unspecified control: Secondary | ICD-10-CM

## 2022-05-26 DIAGNOSIS — Z3A31 31 weeks gestation of pregnancy: Secondary | ICD-10-CM

## 2022-05-26 DIAGNOSIS — O09523 Supervision of elderly multigravida, third trimester: Secondary | ICD-10-CM

## 2022-05-26 DIAGNOSIS — O99013 Anemia complicating pregnancy, third trimester: Secondary | ICD-10-CM

## 2022-05-26 DIAGNOSIS — Z3480 Encounter for supervision of other normal pregnancy, unspecified trimester: Secondary | ICD-10-CM

## 2022-05-26 MED ORDER — FREESTYLE LITE TEST VI STRP: ORAL_STRIP | 12 refills | Status: DC

## 2022-05-26 MED ORDER — FERROUS SULFATE 325 (65 FE) MG PO TABS: 325.0000 mg | ORAL_TABLET | Freq: Two times a day (BID) | ORAL | 1 refills | Status: DC

## 2022-05-26 MED ORDER — FREESTYLE LITE W/DEVICE KIT: 1.0000 | PACK | Freq: Four times a day (QID) | 0 refills | Status: DC

## 2022-05-26 MED ORDER — FREESTYLE LANCETS MISC: 12 refills | Status: DC

## 2022-05-26 NOTE — Progress Notes (Signed)
31.[redacted]wks GA GDM, class scheduled for next week, not checking CBGs yet (insurance doesn't cover Accu check, Freestyle Libre meter and supplies being sent) Iron resent to different pharmacy

## 2022-05-26 NOTE — Progress Notes (Signed)
   PRENATAL VISIT NOTE  Subjective:  Theresa Bernard is a 36 y.o. [id number] at [redacted]w[redacted]d being seen today for ongoing prenatal care.  She is currently monitored for the following issues for this low-risk pregnancy and has Supervision of other normal pregnancy, antepartum; AMA (advanced maternal age) multigravida 35+; and Gestational diabetes on their problem list.  Patient reports no complaints.  Contractions: Not present. Vag. Bleeding: None.  Movement: Present. Denies leaking of fluid.   The following portions of the patient's history were reviewed and updated as appropriate: allergies, current medications, past family history, past medical history, past social history, past surgical history and problem list.   Objective:   Vitals:   05/26/22 1113  BP: 122/76  Pulse: 100  Weight: 217 lb 12.8 oz (98.8 kg)    Fetal Status: Fetal Heart Rate (bpm): 141   Movement: Present     General:  Alert, oriented and cooperative. Patient is in no acute distress.  Skin: Skin is warm and dry. No rash noted.   Cardiovascular: Normal heart rate noted  Respiratory: Normal respiratory effort, no problems with respiration noted  Abdomen: Soft, gravid, appropriate for gestational age.  Pain/Pressure: Absent     Pelvic: Cervical exam deferred        Extremities: Normal range of motion.  Edema: Trace  Mental Status: Normal mood and affect. Normal behavior. Normal judgment and thought content.   Assessment and Plan:  Pregnancy: [id number] at [redacted]w[redacted]d 1. Supervision of other normal pregnancy, antepartum - Doing well, vigorous fetal movement present  2. Gestational diabetes mellitus (GDM) in third trimester, gestational diabetes method of control unspecified - Diabetes supplies ordered today. Patient understands importance of checking blood sugars at home.  - F/U US on 08/01, next week  3. Multigravida of advanced maternal age in third trimester - taking BASA daily  4. [redacted] weeks gestation of pregnancy - F/U visits  have been scheduled until delivery  5. Anemia affecting pregnancy in third trimester - asymptomatic today - ferrous sulfate (FERROUSUL) 325 (65 FE) MG tablet; Take 1 tablet (325 mg total) by mouth 2 (two) times daily.  Dispense: 60 tablet; Refill: 1  Preterm labor symptoms and general obstetric precautions including but not limited to vaginal bleeding, contractions, leaking of fluid and fetal movement were reviewed in detail with the patient. Please refer to After Visit Summary for other counseling recommendations.   Return in about 2 weeks (around 06/09/2022) for IN-PERSON, HOB.  Future Appointments  Date Time Provider Department Center  06/01/2022  7:45 AM [facility] NURSE [facility]  06/01/2022  8:00 AM [facility] US1 [facility]  06/02/2022  2:45 PM [facility] GDM CLASS [facility] NDM  06/08/2022  8:35 AM Adam Phenix, MD [facility] None  06/22/2022  8:55 AM Autry-Lott, Randa Evens, DO [facility] None  06/29/2022  8:35 AM Leftwich-Kirby, Wilmer Floor, CNM [facility] None  07/06/2022  8:35 AM Adam Phenix, MD [facility] None  07/13/2022  8:35 AM Leftwich-Kirby, Wilmer Floor, CNM [facility] None  07/20/2022  8:35 AM Gerrit Heck, CNM [facility] None    Corlis Hove, NP

## 2022-05-26 NOTE — Progress Notes (Signed)
Diabetic supplies sent to pharmacy

## 2022-06-01 ENCOUNTER — Other Ambulatory Visit: Payer: Self-pay | Admitting: *Deleted

## 2022-06-01 ENCOUNTER — Encounter: Payer: Self-pay | Admitting: *Deleted

## 2022-06-01 ENCOUNTER — Ambulatory Visit: Payer: 59 | Attending: Obstetrics

## 2022-06-01 ENCOUNTER — Ambulatory Visit: Payer: 59 | Admitting: *Deleted

## 2022-06-01 VITALS — BP 117/63 | HR 94

## 2022-06-01 DIAGNOSIS — Z3480 Encounter for supervision of other normal pregnancy, unspecified trimester: Secondary | ICD-10-CM

## 2022-06-01 DIAGNOSIS — Z3A32 32 weeks gestation of pregnancy: Secondary | ICD-10-CM | POA: Diagnosis not present

## 2022-06-01 DIAGNOSIS — O09213 Supervision of pregnancy with history of pre-term labor, third trimester: Secondary | ICD-10-CM

## 2022-06-01 DIAGNOSIS — Z362 Encounter for other antenatal screening follow-up: Secondary | ICD-10-CM | POA: Diagnosis not present

## 2022-06-01 DIAGNOSIS — O09523 Supervision of elderly multigravida, third trimester: Secondary | ICD-10-CM | POA: Diagnosis not present

## 2022-06-01 DIAGNOSIS — O09293 Supervision of pregnancy with other poor reproductive or obstetric history, third trimester: Secondary | ICD-10-CM

## 2022-06-01 DIAGNOSIS — O24410 Gestational diabetes mellitus in pregnancy, diet controlled: Secondary | ICD-10-CM

## 2022-06-01 DIAGNOSIS — O99213 Obesity complicating pregnancy, third trimester: Secondary | ICD-10-CM | POA: Insufficient documentation

## 2022-06-01 DIAGNOSIS — E669 Obesity, unspecified: Secondary | ICD-10-CM

## 2022-06-01 DIAGNOSIS — O09292 Supervision of pregnancy with other poor reproductive or obstetric history, second trimester: Secondary | ICD-10-CM | POA: Insufficient documentation

## 2022-06-02 ENCOUNTER — Ambulatory Visit: Payer: 59

## 2022-06-07 ENCOUNTER — Ambulatory Visit: Payer: 59 | Admitting: *Deleted

## 2022-06-07 ENCOUNTER — Ambulatory Visit: Payer: 59 | Attending: Obstetrics

## 2022-06-07 ENCOUNTER — Encounter: Payer: Self-pay | Admitting: *Deleted

## 2022-06-07 VITALS — BP 122/69 | HR 94

## 2022-06-07 DIAGNOSIS — O09293 Supervision of pregnancy with other poor reproductive or obstetric history, third trimester: Secondary | ICD-10-CM | POA: Diagnosis not present

## 2022-06-07 DIAGNOSIS — O24410 Gestational diabetes mellitus in pregnancy, diet controlled: Secondary | ICD-10-CM | POA: Diagnosis present

## 2022-06-07 DIAGNOSIS — O09523 Supervision of elderly multigravida, third trimester: Secondary | ICD-10-CM | POA: Diagnosis not present

## 2022-06-07 DIAGNOSIS — O0943 Supervision of pregnancy with grand multiparity, third trimester: Secondary | ICD-10-CM | POA: Insufficient documentation

## 2022-06-07 DIAGNOSIS — Z3480 Encounter for supervision of other normal pregnancy, unspecified trimester: Secondary | ICD-10-CM

## 2022-06-07 DIAGNOSIS — Z3A33 33 weeks gestation of pregnancy: Secondary | ICD-10-CM | POA: Insufficient documentation

## 2022-06-07 DIAGNOSIS — O99213 Obesity complicating pregnancy, third trimester: Secondary | ICD-10-CM | POA: Diagnosis not present

## 2022-06-07 DIAGNOSIS — O99013 Anemia complicating pregnancy, third trimester: Secondary | ICD-10-CM | POA: Diagnosis not present

## 2022-06-08 ENCOUNTER — Ambulatory Visit (INDEPENDENT_AMBULATORY_CARE_PROVIDER_SITE_OTHER): Payer: 59 | Admitting: Obstetrics and Gynecology

## 2022-06-08 ENCOUNTER — Encounter: Payer: Self-pay | Admitting: Obstetrics and Gynecology

## 2022-06-08 VITALS — BP 116/67 | HR 104 | Wt 217.8 lb

## 2022-06-08 DIAGNOSIS — Z6841 Body Mass Index (BMI) 40.0 and over, adult: Secondary | ICD-10-CM

## 2022-06-08 DIAGNOSIS — O99213 Obesity complicating pregnancy, third trimester: Secondary | ICD-10-CM | POA: Insufficient documentation

## 2022-06-08 DIAGNOSIS — O24419 Gestational diabetes mellitus in pregnancy, unspecified control: Secondary | ICD-10-CM | POA: Diagnosis not present

## 2022-06-08 DIAGNOSIS — Z3A33 33 weeks gestation of pregnancy: Secondary | ICD-10-CM

## 2022-06-08 DIAGNOSIS — Z3480 Encounter for supervision of other normal pregnancy, unspecified trimester: Secondary | ICD-10-CM

## 2022-06-08 DIAGNOSIS — O09893 Supervision of other high risk pregnancies, third trimester: Secondary | ICD-10-CM

## 2022-06-08 DIAGNOSIS — O09293 Supervision of pregnancy with other poor reproductive or obstetric history, third trimester: Secondary | ICD-10-CM

## 2022-06-08 DIAGNOSIS — Z91199 Patient's noncompliance with other medical treatment and regimen due to unspecified reason: Secondary | ICD-10-CM

## 2022-06-08 DIAGNOSIS — O09523 Supervision of elderly multigravida, third trimester: Secondary | ICD-10-CM

## 2022-06-08 DIAGNOSIS — O99013 Anemia complicating pregnancy, third trimester: Secondary | ICD-10-CM

## 2022-06-08 DIAGNOSIS — B977 Papillomavirus as the cause of diseases classified elsewhere: Secondary | ICD-10-CM | POA: Insufficient documentation

## 2022-06-08 DIAGNOSIS — Z3483 Encounter for supervision of other normal pregnancy, third trimester: Secondary | ICD-10-CM

## 2022-06-08 LAB — GLUCOSE, POCT (MANUAL RESULT ENTRY): POC Glucose: 126 mg/dl — AB (ref 70–99)

## 2022-06-08 NOTE — Progress Notes (Signed)
Patient [redacted]w[redacted]d presents for ROB. Patient reports not taking prenatals or checking blood sugars. No concerns at this time.

## 2022-06-08 NOTE — Progress Notes (Signed)
PRENATAL VISIT NOTE  Subjective:  Theresa Bernard is a 36 y.o. Q4B2010 at [redacted]w[redacted]d being seen today for ongoing prenatal care.  She is currently monitored for the following issues for this high-risk pregnancy and has Supervision of other normal pregnancy, antepartum; AMA (advanced maternal age) multigravida 35+; Gestational diabetes; History of macrosomia in infant in prior pregnancy, currently pregnant in third trimester; Obesity in pregnancy, antepartum, third trimester; BMI 40.0-44.9, adult (HCC); HPV (human papilloma virus) infection; and Anemia during pregnancy in third trimester on their problem list.  Patient reports no complaints.  Contractions: Irritability. Vag. Bleeding: None.  Movement: Present. Denies leaking of fluid.   The following portions of the patient's history were reviewed and updated as appropriate: allergies, current medications, past family history, past medical history, past social history, past surgical history and problem list.   Objective:   Vitals:   06/08/22 0822  BP: 116/67  Pulse: (!) 104  Weight: 217 lb 12.8 oz (98.8 kg)    Fetal Status: Fetal Heart Rate (bpm): 146   Movement: Present     General:  Alert, oriented and cooperative. Patient is in no acute distress.  Skin: Skin is warm and dry. No rash noted.   Cardiovascular: Normal heart rate noted  Respiratory: Normal respiratory effort, no problems with respiration noted  Abdomen: Soft, gravid, appropriate for gestational age.  Pain/Pressure: Absent     Pelvic: Cervical exam deferred        Extremities: Normal range of motion.  Edema: Trace  Mental Status: Normal mood and affect. Normal behavior. Normal judgment and thought content.   Assessment and Plan:  Pregnancy: O7H2197 at [redacted]w[redacted]d 1. Supervision of other normal pregnancy, antepartum BTL papers signed 7/7. GBS next visit  2. History of macrosomia in infant in prior pregnancy, currently pregnant in third trimester Never had GDM with pre-2023  pregnancies 2018: 9lbs 13oz Other babies were b/w 8 to 9 1/2 lbs.  No issues with any deliveries  3. GDM Patient not remembering to check CBGs; she had DM education visit tomorrow, confirmed with her. We checked one today and it was 126 and she had a muffin about 1.5 hours prior, which I told her isn't too bad. I told her we recommend checking them qid (AM fasting and 1-2 hours post prandial) with AM fasting being the most important. Importance of CBG control d/w her in regards to maternal/fetal health -continue qwk BPPs and qmonth growth u/s -I told her that if she isn't checking her CBGs then recommend 37wk IOL -check random CBG qvisit 8/7: bpp 8/8, ceph, afi 15 8/1: 81%, ac 99%, afi 14, ceph  4. BMI 40.0-44.9, adult (HCC) stable  5. Obesity in pregnancy, antepartum, third trimester   6. [redacted] weeks gestation of pregnancy   7. Multigravida of advanced maternal age in third trimester  8. Gestational diabetes mellitus (GDM), antepartum, gestational diabetes method of control unspecified  - POCT Glucose (CBG)  9. HPV (human papilloma virus) infection PP pap  10. Anemia during pregnancy in third trimester Hasn't picked up iron yet. D/w her re: importance and to take qod with some vitamin c.   Preterm labor symptoms and general obstetric precautions including but not limited to vaginal bleeding, contractions, leaking of fluid and fetal movement were reviewed in detail with the patient. Please refer to After Visit Summary for other counseling recommendations.   Return in about 1 week (around 06/15/2022) for virtual visit.  Future Appointments  Date Time Provider Department Center  06/09/2022  9:00 AM [facility] GDM CLASS [facility] NDM  06/15/2022  1:00 PM [facility] NURSE [facility]  06/15/2022  1:15 PM [facility] NST [facility]  06/22/2022  8:55 AM Autry-Lott, Simone, DO [facility] None  06/23/2022  7:15 AM [facility] NURSE [facility]  06/23/2022  7:30 AM [facility] US2 [facility]  06/29/2022   8:35 AM Leftwich-Kirby, Wilmer Floor, CNM [facility] None  06/30/2022  7:15 AM [facility] NURSE [facility]  06/30/2022  7:30 AM [facility] US3 [facility]  07/06/2022  8:35 AM Adam Phenix, MD [facility] None  07/13/2022  8:35 AM Leftwich-Kirby, Wilmer Floor, CNM [facility] None  07/20/2022  8:35 AM Gerrit Heck, CNM [facility] None    [place] Bing, MD

## 2022-06-09 ENCOUNTER — Ambulatory Visit: Payer: 59 | Admitting: Registered"

## 2022-06-10 ENCOUNTER — Encounter: Payer: Self-pay | Admitting: Obstetrics and Gynecology

## 2022-06-15 ENCOUNTER — Ambulatory Visit: Payer: 59 | Attending: Obstetrics | Admitting: *Deleted

## 2022-06-15 ENCOUNTER — Ambulatory Visit: Payer: 59 | Admitting: *Deleted

## 2022-06-15 VITALS — BP 122/69 | HR 98

## 2022-06-15 DIAGNOSIS — O99213 Obesity complicating pregnancy, third trimester: Secondary | ICD-10-CM | POA: Diagnosis not present

## 2022-06-15 DIAGNOSIS — Z3A34 34 weeks gestation of pregnancy: Secondary | ICD-10-CM | POA: Diagnosis not present

## 2022-06-15 DIAGNOSIS — O09523 Supervision of elderly multigravida, third trimester: Secondary | ICD-10-CM | POA: Diagnosis not present

## 2022-06-15 DIAGNOSIS — O24419 Gestational diabetes mellitus in pregnancy, unspecified control: Secondary | ICD-10-CM

## 2022-06-15 DIAGNOSIS — O24410 Gestational diabetes mellitus in pregnancy, diet controlled: Secondary | ICD-10-CM | POA: Diagnosis present

## 2022-06-15 DIAGNOSIS — Z3480 Encounter for supervision of other normal pregnancy, unspecified trimester: Secondary | ICD-10-CM

## 2022-06-15 NOTE — Procedures (Signed)
Theresa Bernard [birth year] [redacted]w[redacted]d  Fetus A Non-Stress Test Interpretation for 06/15/22  Indication:  GDM-diet, obesity, AMA  Fetal Heart Rate A Mode: External Baseline Rate (A): 140 bpm Variability: Moderate Accelerations: 15 x 15 Decelerations: None Multiple birth?: No  Uterine Activity Mode: Palpation, Toco Contraction Frequency (min): Occas Contraction Quality: Mild Resting Tone Palpated: Relaxed Resting Time: Adequate  Interpretation (Fetal Testing) Nonstress Test Interpretation: Reactive Comments: Dr. Grace Bushy reviewed tracing.

## 2022-06-16 ENCOUNTER — Telehealth: Payer: Self-pay

## 2022-06-16 ENCOUNTER — Encounter: Payer: Self-pay | Admitting: Obstetrics and Gynecology

## 2022-06-16 NOTE — Telephone Encounter (Signed)
2nd attempt to reach patient regarding FMLA paperwork. Message was left to call back.

## 2022-06-16 NOTE — Telephone Encounter (Signed)
Attempted to call patient and phone was disconnected. Reached out to husband and he informed me that the patient's phone should be connected and that she was at an appointment. Told the husband to have patient call me regarding FMLA paperwork. He verbalized understanding.

## 2022-06-17 NOTE — Progress Notes (Signed)
FMLA paperwork was filled out and faxed to [employer]. Patient informed of this.

## 2022-06-20 NOTE — Progress Notes (Unsigned)
   PRENATAL VISIT NOTE  Subjective:  Theresa Bernard is a 36 y.o. [id number] at [redacted]w[redacted]d being seen today for ongoing prenatal care.  She is currently monitored for the following issues for this {Blank single:19197::"high-risk","low-risk"} pregnancy and has Supervision of other normal pregnancy, antepartum; AMA (advanced maternal age) multigravida 35+; Gestational diabetes; History of macrosomia in infant in prior pregnancy, currently pregnant in third trimester; Obesity in pregnancy, antepartum, third trimester; BMI 40.0-44.9, adult (HCC); HPV (human papilloma virus) infection; and Anemia during pregnancy in third trimester on their problem list.  Patient reports {sx:14538}.   .  .   . Denies leaking of fluid.   The following portions of the patient's history were reviewed and updated as appropriate: allergies, current medications, past family history, past medical history, past social history, past surgical history and problem list.   Objective:  There were no vitals filed for this visit.  Fetal Status:           General:  Alert, oriented and cooperative. Patient is in no acute distress.  Skin: Skin is warm and dry. No rash noted.   Cardiovascular: Normal heart rate noted  Respiratory: Normal respiratory effort, no problems with respiration noted  Abdomen: Soft, gravid, appropriate for gestational age.        Pelvic: {Blank single:19197::"Cervical exam performed in the presence of a chaperone","Cervical exam deferred"}        Extremities: Normal range of motion.     Mental Status: Normal mood and affect. Normal behavior. Normal judgment and thought content.   Assessment and Plan:  Pregnancy: A5W0981 at [redacted]w[redacted]d There are no diagnoses linked to this encounter. {Blank single:19197::"Term","Preterm"} labor symptoms and general obstetric precautions including but not limited to vaginal bleeding, contractions, leaking of fluid and fetal movement were reviewed in detail with the patient. Please refer  to After Visit Summary for other counseling recommendations.   No follow-ups on file.  Future Appointments  Date Time Provider Department Center  06/22/2022  8:55 AM Autry-Lott, Randa Evens, DO [facility] None  06/23/2022  7:15 AM [facility] NURSE [facility]  06/23/2022  7:30 AM [facility] US2 [facility]  06/29/2022  8:35 AM Leftwich-Kirby, Wilmer Floor, CNM [facility] None  06/30/2022  7:15 AM [facility] NURSE [facility]  06/30/2022  7:30 AM [facility] US3 [facility]  07/06/2022  8:35 AM Adam Phenix, MD [facility] None  07/13/2022  8:35 AM Leftwich-Kirby, Wilmer Floor, CNM [facility] None  07/20/2022  8:35 AM Gerrit Heck, CNM [facility] None    Karem Tomaso Autry-Lott, DO

## 2022-06-22 ENCOUNTER — Telehealth (INDEPENDENT_AMBULATORY_CARE_PROVIDER_SITE_OTHER): Payer: 59 | Admitting: Family Medicine

## 2022-06-22 VITALS — BP 140/75 | HR 105 | Wt 221.0 lb

## 2022-06-22 DIAGNOSIS — Z3480 Encounter for supervision of other normal pregnancy, unspecified trimester: Secondary | ICD-10-CM

## 2022-06-22 DIAGNOSIS — R030 Elevated blood-pressure reading, without diagnosis of hypertension: Secondary | ICD-10-CM

## 2022-06-22 DIAGNOSIS — Z3A35 35 weeks gestation of pregnancy: Secondary | ICD-10-CM

## 2022-06-22 DIAGNOSIS — O24419 Gestational diabetes mellitus in pregnancy, unspecified control: Secondary | ICD-10-CM

## 2022-06-23 ENCOUNTER — Ambulatory Visit: Payer: 59

## 2022-06-23 ENCOUNTER — Ambulatory Visit: Payer: 59 | Attending: Obstetrics

## 2022-06-29 ENCOUNTER — Encounter: Payer: Self-pay | Admitting: Advanced Practice Midwife

## 2022-06-29 ENCOUNTER — Other Ambulatory Visit (HOSPITAL_COMMUNITY)
Admission: RE | Admit: 2022-06-29 | Discharge: 2022-06-29 | Disposition: A | Discharge status: Home / Self Care | Payer: 59 | Source: Ambulatory Visit | Attending: Advanced Practice Midwife | Admitting: Advanced Practice Midwife

## 2022-06-29 ENCOUNTER — Ambulatory Visit (INDEPENDENT_AMBULATORY_CARE_PROVIDER_SITE_OTHER): Payer: 59 | Admitting: Advanced Practice Midwife

## 2022-06-29 VITALS — BP 125/74 | HR 93 | Wt 218.4 lb

## 2022-06-29 DIAGNOSIS — Z3480 Encounter for supervision of other normal pregnancy, unspecified trimester: Secondary | ICD-10-CM | POA: Diagnosis not present

## 2022-06-29 DIAGNOSIS — Z3A36 36 weeks gestation of pregnancy: Secondary | ICD-10-CM

## 2022-06-29 DIAGNOSIS — O24419 Gestational diabetes mellitus in pregnancy, unspecified control: Secondary | ICD-10-CM | POA: Diagnosis not present

## 2022-06-29 DIAGNOSIS — O24410 Gestational diabetes mellitus in pregnancy, diet controlled: Secondary | ICD-10-CM

## 2022-06-29 LAB — GLUCOSE, POCT (MANUAL RESULT ENTRY): POC Glucose: 147 mg/dl — AB (ref 70–99)

## 2022-06-29 NOTE — Progress Notes (Addendum)
PRENATAL VISIT NOTE  Subjective:  Theresa Bernard is a 36 y.o. G2X5284 at [redacted]w[redacted]d being seen today for ongoing prenatal care.  She is currently monitored for the following issues for this low-risk pregnancy and has Supervision of other normal pregnancy, antepartum; AMA (advanced maternal age) multigravida 35+; Gestational diabetes; History of macrosomia in infant in prior pregnancy, currently pregnant in third trimester; Obesity in pregnancy, antepartum, third trimester; BMI 40.0-44.9, adult (HCC); HPV (human papilloma virus) infection; and Anemia during pregnancy in third trimester on their problem list.  Patient reports occasional contractions.  Contractions: Irregular. Vag. Bleeding: None.  Movement: Present. Denies leaking of fluid.   The following portions of the patient's history were reviewed and updated as appropriate: allergies, current medications, past family history, past medical history, past social history, past surgical history and problem list.   Objective:   Vitals:   06/29/22 0818  BP: 125/74  Pulse: 93  Weight: 218 lb 6.4 oz (99.1 kg)    Fetal Status: Fetal Heart Rate (bpm): 150 Fundal Height: 39 cm Movement: Present  Presentation: Vertex  General:  Alert, oriented and cooperative. Patient is in no acute distress.  Skin: Skin is warm and dry. No rash noted.   Cardiovascular: Normal heart rate noted  Respiratory: Normal respiratory effort, no problems with respiration noted  Abdomen: Soft, gravid, appropriate for gestational age.  Pain/Pressure: Absent     Pelvic: Cervical exam performed in the presence of a chaperone Dilation: Fingertip Effacement (%): 0 Station: -3  Extremities: Normal range of motion.  Edema: Trace  Mental Status: Normal mood and affect. Normal behavior. Normal judgment and thought content.   Assessment and Plan:  Pregnancy: X3K4401 at [redacted]w[redacted]d  1. Supervision of other normal pregnancy, antepartum --Anticipatory guidance about next visits/weeks of  pregnancy given.  --Reviewed labor readiness with patient including the [facility] [employer], evening primrose oil, and raspberry leaf tea.   --Cervix 0.5/long/-3, but soft and mid position on today's exam  - Culture, beta strep (group b only) - Cervicovaginal ancillary only( [place])  2. [redacted] weeks gestation of pregnancy   3. Gestational diabetes mellitus (GDM), antepartum, gestational diabetes method of control unspecified --Random glucose today 147, within 1 hour of oatmeal and cranberry juice for breakfast --Reviewed pt log for last 17 days, 3 fastings out of 17 elevated, 1 at 135, 2 at 91.  Postprandial values, 5 out of 45 elevated, highest 138.  --Pt with <20% values out of range so good blood sugar control --Reviewed recommended IOL for GDM as 37-39 weeks, pt prefers to labor on her own but understands risks of GDM and consents to IOL at 38+ weeks.   --EFW 81% on 8/1, pt with hx two 9 lb babies vaginally without difficulty --IOL scheduled at [redacted]w[redacted]d, 07/13/22. Growth [place] on 8/30, so will reevaluate IOL with any changes  - POCT Glucose (CBG)   Term labor symptoms and general obstetric precautions including but not limited to vaginal bleeding, contractions, leaking of fluid and fetal movement were reviewed in detail with the patient. Please refer to After Visit Summary for other counseling recommendations.   Return in about 1 week (around 07/06/2022) for LOB, Any provider.  Future Appointments  Date Time Provider Department Center  06/30/2022  7:15 AM [facility] NURSE [facility]  06/30/2022  7:30 AM [facility] US3 [facility]  07/06/2022  8:35 AM Adam Phenix, MD [facility] None  07/13/2022  8:35 AM Leftwich-Kirby, Wilmer Floor, CNM [facility] None  07/20/2022  8:35 AM Gerrit Heck,  CNM [facility] None    Sharen Counter, CNM

## 2022-06-29 NOTE — Progress Notes (Signed)
Pt presents for ROB visit. Pt has questions about induction at 39 weeks. No other concerns today.   CBG at 7AM was 93  CBG 147 in office at 8:30

## 2022-06-29 NOTE — Patient Instructions (Signed)
Labor Precautions Reasons to come to MAU at [facility]:  1.  Contractions are  5 minutes apart or less, each last 1 minute, these have been going on for 1-2 hours, and you cannot walk or talk during them 2.  You have a large gush of fluid, or a trickle of fluid that will not stop and you have to wear a pad 3.  You have bleeding that is bright red, heavier than spotting--like menstrual bleeding (spotting can be normal in early labor or after a check of your cervix) 4.  You do not feel the baby moving like he/she normally does  

## 2022-06-30 ENCOUNTER — Ambulatory Visit: Payer: 59 | Attending: Obstetrics

## 2022-06-30 ENCOUNTER — Other Ambulatory Visit: Payer: Self-pay | Admitting: *Deleted

## 2022-06-30 ENCOUNTER — Ambulatory Visit: Payer: 59 | Admitting: *Deleted

## 2022-06-30 VITALS — BP 124/73 | HR 92

## 2022-06-30 DIAGNOSIS — O09293 Supervision of pregnancy with other poor reproductive or obstetric history, third trimester: Secondary | ICD-10-CM | POA: Diagnosis present

## 2022-06-30 DIAGNOSIS — O24410 Gestational diabetes mellitus in pregnancy, diet controlled: Secondary | ICD-10-CM | POA: Diagnosis not present

## 2022-06-30 DIAGNOSIS — O24419 Gestational diabetes mellitus in pregnancy, unspecified control: Secondary | ICD-10-CM

## 2022-06-30 DIAGNOSIS — O09523 Supervision of elderly multigravida, third trimester: Secondary | ICD-10-CM | POA: Diagnosis not present

## 2022-06-30 DIAGNOSIS — O99213 Obesity complicating pregnancy, third trimester: Secondary | ICD-10-CM

## 2022-06-30 LAB — CERVICOVAGINAL ANCILLARY ONLY
Chlamydia: NEGATIVE
Comment: NEGATIVE
Comment: NEGATIVE
Comment: NORMAL
Neisseria Gonorrhea: NEGATIVE
Trichomonas: NEGATIVE

## 2022-07-03 LAB — CULTURE, BETA STREP (GROUP B ONLY): Strep Gp B Culture: NEGATIVE

## 2022-07-06 ENCOUNTER — Encounter (HOSPITAL_COMMUNITY): Payer: Self-pay

## 2022-07-06 ENCOUNTER — Ambulatory Visit (INDEPENDENT_AMBULATORY_CARE_PROVIDER_SITE_OTHER): Payer: 59 | Admitting: Obstetrics & Gynecology

## 2022-07-06 ENCOUNTER — Telehealth (HOSPITAL_COMMUNITY): Payer: Self-pay | Admitting: *Deleted

## 2022-07-06 VITALS — BP 123/76 | HR 87 | Wt 222.2 lb

## 2022-07-06 DIAGNOSIS — O99213 Obesity complicating pregnancy, third trimester: Secondary | ICD-10-CM

## 2022-07-06 DIAGNOSIS — Z3483 Encounter for supervision of other normal pregnancy, third trimester: Secondary | ICD-10-CM

## 2022-07-06 DIAGNOSIS — O09523 Supervision of elderly multigravida, third trimester: Secondary | ICD-10-CM

## 2022-07-06 DIAGNOSIS — Z3A37 37 weeks gestation of pregnancy: Secondary | ICD-10-CM

## 2022-07-06 DIAGNOSIS — Z3480 Encounter for supervision of other normal pregnancy, unspecified trimester: Secondary | ICD-10-CM

## 2022-07-06 DIAGNOSIS — O24410 Gestational diabetes mellitus in pregnancy, diet controlled: Secondary | ICD-10-CM

## 2022-07-06 NOTE — Progress Notes (Signed)
Pt reports fetal movement, denies pain. Fasting BG today was 92

## 2022-07-06 NOTE — Telephone Encounter (Signed)
Preadmission screen  

## 2022-07-06 NOTE — Progress Notes (Signed)
   PRENATAL VISIT NOTE  Subjective:  Theresa Bernard is a 36 y.o. N5A2130 at [redacted]w[redacted]d being seen today for ongoing prenatal care.  She is currently monitored for the following issues for this high-risk pregnancy and has Supervision of other normal pregnancy, antepartum; AMA (advanced maternal age) multigravida 35+; Gestational diabetes, diet controlled; History of macrosomia in infant in prior pregnancy, currently pregnant in third trimester; Obesity in pregnancy, antepartum, third trimester; BMI 40.0-44.9, adult (HCC); HPV (human papilloma virus) infection; and Anemia during pregnancy in third trimester on their problem list.  Patient reports carpal tunnel symptoms and occasional contractions.  Contractions: Not present. Vag. Bleeding: None.  Movement: Present. Denies leaking of fluid.   The following portions of the patient's history were reviewed and updated as appropriate: allergies, current medications, past family history, past medical history, past social history, past surgical history and problem list.   Objective:   Vitals:   07/06/22 0833  BP: 123/76  Pulse: 87  Weight: 222 lb 3.2 oz (100.8 kg)    Fetal Status: Fetal Heart Rate (bpm): 136   Movement: Present     General:  Alert, oriented and cooperative. Patient is in no acute distress.  Skin: Skin is warm and dry. No rash noted.   Cardiovascular: Normal heart rate noted  Respiratory: Normal respiratory effort, no problems with respiration noted  Abdomen: Soft, gravid, appropriate for gestational age.  Pain/Pressure: Absent     Pelvic: Cervical exam performed in the presence of a chaperone        Extremities: Normal range of motion.  Edema: Trace  Mental Status: Normal mood and affect. Normal behavior. Normal judgment and thought content.   Assessment and Plan:  Pregnancy: Q6V7846 at [redacted]w[redacted]d 1. Supervision of other normal pregnancy, antepartum   2. Multigravida of advanced maternal age in third trimester   3. Diet controlled  gestational diabetes mellitus (GDM) in third trimester Good control  4. Obesity in pregnancy, antepartum, third trimester Body mass index is 41.98 kg/m.   Preterm labor symptoms and general obstetric precautions including but not limited to vaginal bleeding, contractions, leaking of fluid and fetal movement were reviewed in detail with the patient. Please refer to After Visit Summary for other counseling recommendations.   Return in about 1 week (around 07/13/2022).  Future Appointments  Date Time Provider Department Center  07/08/2022  2:45 PM [facility] NURSE [facility]  07/08/2022  3:00 PM [facility] US1 [facility]  07/13/2022  8:35 AM Leftwich-Kirby, Wilmer Floor, CNM [facility] None  07/14/2022  7:30 AM [facility] NURSE [facility]  07/14/2022  7:45 AM [facility] US5 [facility]  07/17/2022  7:15 AM [facility] SCHED ROOM [facility] None  07/20/2022  8:35 AM Gerrit Heck, CNM [facility] None    Scheryl Darter, MD

## 2022-07-07 ENCOUNTER — Encounter (HOSPITAL_COMMUNITY): Payer: Self-pay | Admitting: *Deleted

## 2022-07-07 ENCOUNTER — Telehealth (HOSPITAL_COMMUNITY): Payer: Self-pay | Admitting: *Deleted

## 2022-07-07 NOTE — Telephone Encounter (Signed)
Preadmission screen  

## 2022-07-08 ENCOUNTER — Ambulatory Visit: Payer: 59 | Admitting: *Deleted

## 2022-07-08 ENCOUNTER — Ambulatory Visit: Payer: 59 | Attending: Obstetrics

## 2022-07-08 VITALS — BP 129/79 | HR 98

## 2022-07-08 DIAGNOSIS — O3663X0 Maternal care for excessive fetal growth, third trimester, not applicable or unspecified: Secondary | ICD-10-CM | POA: Diagnosis not present

## 2022-07-08 DIAGNOSIS — O24419 Gestational diabetes mellitus in pregnancy, unspecified control: Secondary | ICD-10-CM | POA: Insufficient documentation

## 2022-07-08 DIAGNOSIS — O99213 Obesity complicating pregnancy, third trimester: Secondary | ICD-10-CM | POA: Insufficient documentation

## 2022-07-08 DIAGNOSIS — O24410 Gestational diabetes mellitus in pregnancy, diet controlled: Secondary | ICD-10-CM | POA: Insufficient documentation

## 2022-07-08 DIAGNOSIS — O09293 Supervision of pregnancy with other poor reproductive or obstetric history, third trimester: Secondary | ICD-10-CM | POA: Insufficient documentation

## 2022-07-08 DIAGNOSIS — O0943 Supervision of pregnancy with grand multiparity, third trimester: Secondary | ICD-10-CM | POA: Diagnosis not present

## 2022-07-08 DIAGNOSIS — Z3480 Encounter for supervision of other normal pregnancy, unspecified trimester: Secondary | ICD-10-CM

## 2022-07-08 DIAGNOSIS — O09523 Supervision of elderly multigravida, third trimester: Secondary | ICD-10-CM | POA: Insufficient documentation

## 2022-07-08 DIAGNOSIS — Z3A38 38 weeks gestation of pregnancy: Secondary | ICD-10-CM | POA: Insufficient documentation

## 2022-07-08 DIAGNOSIS — O99013 Anemia complicating pregnancy, third trimester: Secondary | ICD-10-CM | POA: Diagnosis not present

## 2022-07-13 ENCOUNTER — Inpatient Hospital Stay (HOSPITAL_COMMUNITY): Payer: 59

## 2022-07-13 ENCOUNTER — Ambulatory Visit (INDEPENDENT_AMBULATORY_CARE_PROVIDER_SITE_OTHER): Payer: 59 | Admitting: Advanced Practice Midwife

## 2022-07-13 VITALS — BP 128/82 | HR 89 | Wt 221.0 lb

## 2022-07-13 DIAGNOSIS — Z3480 Encounter for supervision of other normal pregnancy, unspecified trimester: Secondary | ICD-10-CM

## 2022-07-13 DIAGNOSIS — O99213 Obesity complicating pregnancy, third trimester: Secondary | ICD-10-CM

## 2022-07-13 DIAGNOSIS — O24410 Gestational diabetes mellitus in pregnancy, diet controlled: Secondary | ICD-10-CM

## 2022-07-13 DIAGNOSIS — O09523 Supervision of elderly multigravida, third trimester: Secondary | ICD-10-CM

## 2022-07-13 DIAGNOSIS — Z3A38 38 weeks gestation of pregnancy: Secondary | ICD-10-CM

## 2022-07-13 NOTE — Progress Notes (Signed)
   PRENATAL VISIT NOTE  Subjective:  Theresa Bernard is a 36 y.o. C1Y6063 at [redacted]w[redacted]d being seen today for ongoing prenatal care.  She is currently monitored for the following issues for this high-risk pregnancy and has Supervision of other normal pregnancy, antepartum; AMA (advanced maternal age) multigravida 35+; Gestational diabetes, diet controlled; History of macrosomia in infant in prior pregnancy, currently pregnant in third trimester; Obesity in pregnancy, antepartum, third trimester; BMI 40.0-44.9, adult (HCC); HPV (human papilloma virus) infection; and Anemia during pregnancy in third trimester on their problem list.  Patient reports occasional contractions.  Contractions: Not present. Vag. Bleeding: None.  Movement: Present. Denies leaking of fluid.   The following portions of the patient's history were reviewed and updated as appropriate: allergies, current medications, past family history, past medical history, past social history, past surgical history and problem list.   Objective:   Vitals:   07/13/22 0819  BP: 128/82  Pulse: 89  Weight: 221 lb (100.2 kg)    Fetal Status: Fetal Heart Rate (bpm): 148 Fundal Height: 40 cm Movement: Present  Presentation: Vertex  General:  Alert, oriented and cooperative. Patient is in no acute distress.  Skin: Skin is warm and dry. No rash noted.   Cardiovascular: Normal heart rate noted  Respiratory: Normal respiratory effort, no problems with respiration noted  Abdomen: Soft, gravid, appropriate for gestational age.  Pain/Pressure: Present     Pelvic: Cervical exam performed in the presence of a chaperone Dilation: 1 Effacement (%): 40 Station: -3  Extremities: Normal range of motion.  Edema: Mild pitting, slight indentation  Mental Status: Normal mood and affect. Normal behavior. Normal judgment and thought content.   Assessment and Plan:  Pregnancy: K1S0109 at [redacted]w[redacted]d 1. Supervision of other normal pregnancy, antepartum --Anticipatory  guidance about next visits/weeks of pregnancy given.   2. Multigravida of advanced maternal age in third trimester   3. Diet controlled gestational diabetes mellitus (GDM) in third trimester --Reviewed log, < 20% values out of range  4. Obesity in pregnancy, antepartum, third trimester   5. [redacted] weeks gestation of pregnancy -IOL at 32 weeks, scheduled 07/17/22    Preterm labor symptoms and general obstetric precautions including but not limited to vaginal bleeding, contractions, leaking of fluid and fetal movement were reviewed in detail with the patient. Please refer to After Visit Summary for other counseling recommendations.   Return for As scheduled.  Future Appointments  Date Time Provider Department Center  07/17/2022  7:15 AM [facility] SCHED ROOM [facility] None    Sharen Counter, CNM

## 2022-07-13 NOTE — Patient Instructions (Signed)
Labor Precautions Reasons to come to MAU at [facility]:  1.  Contractions are  5 minutes apart or less, each last 1 minute, these have been going on for 1-2 hours, and you cannot walk or talk during them 2.  You have a large gush of fluid, or a trickle of fluid that will not stop and you have to wear a pad 3.  You have bleeding that is bright red, heavier than spotting--like menstrual bleeding (spotting can be normal in early labor or after a check of your cervix) 4.  You do not feel the baby moving like he/she normally does  

## 2022-07-13 NOTE — Progress Notes (Signed)
ROB, requested that her membranes be striped.  Induction scheduled for 07/17/22

## 2022-07-14 ENCOUNTER — Ambulatory Visit: Payer: 59

## 2022-07-14 ENCOUNTER — Other Ambulatory Visit: Payer: Self-pay | Admitting: Advanced Practice Midwife

## 2022-07-17 ENCOUNTER — Inpatient Hospital Stay (HOSPITAL_COMMUNITY): Payer: 59

## 2022-07-17 ENCOUNTER — Inpatient Hospital Stay (HOSPITAL_COMMUNITY): Admission: AD | Admit: 2022-07-17 | Payer: 59 | Source: Home / Self Care | Admitting: Obstetrics and Gynecology

## 2022-07-17 ENCOUNTER — Encounter: Payer: Self-pay | Admitting: Obstetrics and Gynecology

## 2022-07-17 ENCOUNTER — Encounter: Payer: Self-pay | Admitting: Advanced Practice Midwife

## 2022-07-18 ENCOUNTER — Inpatient Hospital Stay (HOSPITAL_COMMUNITY): Payer: 59 | Admitting: Anesthesiology

## 2022-07-18 ENCOUNTER — Other Ambulatory Visit: Payer: Self-pay

## 2022-07-18 ENCOUNTER — Inpatient Hospital Stay (HOSPITAL_COMMUNITY)
Admission: AD | Admit: 2022-07-18 | Discharge: 2022-07-20 | DRG: 807 | Disposition: A | Discharge status: Home / Self Care | Payer: 59 | Attending: Family Medicine | Admitting: Family Medicine

## 2022-07-18 ENCOUNTER — Encounter (HOSPITAL_COMMUNITY): Payer: Self-pay | Admitting: Family Medicine

## 2022-07-18 DIAGNOSIS — Z3480 Encounter for supervision of other normal pregnancy, unspecified trimester: Principal | ICD-10-CM

## 2022-07-18 DIAGNOSIS — O99013 Anemia complicating pregnancy, third trimester: Secondary | ICD-10-CM

## 2022-07-18 DIAGNOSIS — O9902 Anemia complicating childbirth: Secondary | ICD-10-CM | POA: Diagnosis present

## 2022-07-18 DIAGNOSIS — O24410 Gestational diabetes mellitus in pregnancy, diet controlled: Secondary | ICD-10-CM

## 2022-07-18 DIAGNOSIS — O3663X1 Maternal care for excessive fetal growth, third trimester, fetus 1: Secondary | ICD-10-CM | POA: Diagnosis not present

## 2022-07-18 DIAGNOSIS — O3663X0 Maternal care for excessive fetal growth, third trimester, not applicable or unspecified: Secondary | ICD-10-CM | POA: Diagnosis present

## 2022-07-18 DIAGNOSIS — O99213 Obesity complicating pregnancy, third trimester: Secondary | ICD-10-CM | POA: Diagnosis present

## 2022-07-18 DIAGNOSIS — O09529 Supervision of elderly multigravida, unspecified trimester: Secondary | ICD-10-CM

## 2022-07-18 DIAGNOSIS — O24420 Gestational diabetes mellitus in childbirth, diet controlled: Secondary | ICD-10-CM | POA: Diagnosis present

## 2022-07-18 DIAGNOSIS — O24419 Gestational diabetes mellitus in pregnancy, unspecified control: Secondary | ICD-10-CM

## 2022-07-18 DIAGNOSIS — O09293 Supervision of pregnancy with other poor reproductive or obstetric history, third trimester: Secondary | ICD-10-CM

## 2022-07-18 DIAGNOSIS — Z3A39 39 weeks gestation of pregnancy: Secondary | ICD-10-CM

## 2022-07-18 DIAGNOSIS — O99214 Obesity complicating childbirth: Secondary | ICD-10-CM | POA: Diagnosis present

## 2022-07-18 LAB — CBC
HCT: 28.6 % — ABNORMAL LOW (ref 36.0–46.0)
Hemoglobin: 9.5 g/dL — ABNORMAL LOW (ref 12.0–15.0)
MCH: 27.6 pg (ref 26.0–34.0)
MCHC: 33.2 g/dL (ref 30.0–36.0)
MCV: 83.1 fL (ref 80.0–100.0)
Platelets: 196 10*3/uL (ref 150–400)
RBC: 3.44 MIL/uL — ABNORMAL LOW (ref 3.87–5.11)
RDW: 14.7 % (ref 11.5–15.5)
WBC: 10.6 10*3/uL — ABNORMAL HIGH (ref 4.0–10.5)
nRBC: 0 % (ref 0.0–0.2)

## 2022-07-18 LAB — RPR: RPR Ser Ql: NONREACTIVE

## 2022-07-18 LAB — TYPE AND SCREEN
ABO/RH(D): O POS
Antibody Screen: NEGATIVE

## 2022-07-18 LAB — GLUCOSE, CAPILLARY
Glucose-Capillary: 110 mg/dL — ABNORMAL HIGH (ref 70–99)
Glucose-Capillary: 80 mg/dL (ref 70–99)

## 2022-07-18 MED ORDER — LACTATED RINGERS IV SOLN
500.0000 mL | INTRAVENOUS | Status: DC | PRN
  Administered 2022-07-18: 500 mL via INTRAVENOUS

## 2022-07-18 MED ORDER — FENTANYL-BUPIVACAINE-NACL 0.5-0.125-0.9 MG/250ML-% EP SOLN
EPIDURAL | Status: DC | PRN
  Administered 2022-07-18: 12 mL/h via EPIDURAL

## 2022-07-18 MED ORDER — OXYTOCIN-SODIUM CHLORIDE 30-0.9 UT/500ML-% IV SOLN
2.5000 [IU]/h | INTRAVENOUS | Status: DC
  Filled 2022-07-18: qty 500

## 2022-07-18 MED ORDER — EPHEDRINE 5 MG/ML INJ: 10.0000 mg | INTRAVENOUS | Status: DC | PRN

## 2022-07-18 MED ORDER — ONDANSETRON HCL 4 MG/2ML IJ SOLN: 4.0000 mg | Freq: Four times a day (QID) | INTRAMUSCULAR | Status: DC | PRN

## 2022-07-18 MED ORDER — PRENATAL MULTIVITAMIN CH
1.0000 | ORAL_TABLET | Freq: Every day | ORAL | Status: DC
  Administered 2022-07-19 – 2022-07-20 (×2): 1 via ORAL
  Filled 2022-07-18 (×2): qty 1

## 2022-07-18 MED ORDER — FENTANYL CITRATE (PF) 100 MCG/2ML IJ SOLN
INTRAMUSCULAR | Status: AC
  Administered 2022-07-18: 100 ug via INTRAVENOUS
  Filled 2022-07-18: qty 2

## 2022-07-18 MED ORDER — PHENYLEPHRINE 80 MCG/ML (10ML) SYRINGE FOR IV PUSH (FOR BLOOD PRESSURE SUPPORT)
80.0000 ug | PREFILLED_SYRINGE | INTRAVENOUS | Status: AC | PRN
  Administered 2022-07-18 (×3): 80 ug via INTRAVENOUS
  Filled 2022-07-18: qty 10

## 2022-07-18 MED ORDER — FENTANYL CITRATE (PF) 100 MCG/2ML IJ SOLN: 100.0000 ug | Freq: Once | INTRAMUSCULAR | Status: AC

## 2022-07-18 MED ORDER — TERBUTALINE SULFATE 1 MG/ML IJ SOLN: 0.2500 mg | Freq: Once | INTRAMUSCULAR | Status: DC | PRN

## 2022-07-18 MED ORDER — DIPHENHYDRAMINE HCL 25 MG PO CAPS: 25.0000 mg | ORAL_CAPSULE | Freq: Four times a day (QID) | ORAL | Status: DC | PRN

## 2022-07-18 MED ORDER — FERROUS SULFATE 325 (65 FE) MG PO TABS
325.0000 mg | ORAL_TABLET | Freq: Two times a day (BID) | ORAL | Status: DC
  Administered 2022-07-18 – 2022-07-20 (×3): 325 mg via ORAL
  Filled 2022-07-18 (×3): qty 1

## 2022-07-18 MED ORDER — LIDOCAINE HCL (PF) 1 % IJ SOLN: 30.0000 mL | INTRAMUSCULAR | Status: DC | PRN

## 2022-07-18 MED ORDER — OXYCODONE-ACETAMINOPHEN 5-325 MG PO TABS: 1.0000 | ORAL_TABLET | ORAL | Status: DC | PRN

## 2022-07-18 MED ORDER — DIBUCAINE (PERIANAL) 1 % EX OINT: 1.0000 | TOPICAL_OINTMENT | CUTANEOUS | Status: DC | PRN

## 2022-07-18 MED ORDER — FENTANYL-BUPIVACAINE-NACL 0.5-0.125-0.9 MG/250ML-% EP SOLN
EPIDURAL | Status: AC
  Filled 2022-07-18: qty 250

## 2022-07-18 MED ORDER — PHENYLEPHRINE 80 MCG/ML (10ML) SYRINGE FOR IV PUSH (FOR BLOOD PRESSURE SUPPORT): 80.0000 ug | PREFILLED_SYRINGE | INTRAVENOUS | Status: DC | PRN

## 2022-07-18 MED ORDER — FENTANYL-BUPIVACAINE-NACL 0.5-0.125-0.9 MG/250ML-% EP SOLN
12.0000 mL/h | EPIDURAL | Status: DC | PRN
  Filled 2022-07-18: qty 250

## 2022-07-18 MED ORDER — SENNOSIDES-DOCUSATE SODIUM 8.6-50 MG PO TABS
2.0000 | ORAL_TABLET | Freq: Every day | ORAL | Status: DC
  Administered 2022-07-19 – 2022-07-20 (×2): 2 via ORAL
  Filled 2022-07-18 (×2): qty 2

## 2022-07-18 MED ORDER — DIPHENHYDRAMINE HCL 50 MG/ML IJ SOLN: 12.5000 mg | INTRAMUSCULAR | Status: DC | PRN

## 2022-07-18 MED ORDER — ACETAMINOPHEN 325 MG PO TABS: 650.0000 mg | ORAL_TABLET | ORAL | Status: DC | PRN

## 2022-07-18 MED ORDER — LACTATED RINGERS IV SOLN
500.0000 mL | Freq: Once | INTRAVENOUS | Status: AC
  Administered 2022-07-18: 500 mL via INTRAVENOUS

## 2022-07-18 MED ORDER — TETANUS-DIPHTH-ACELL PERTUSSIS 5-2.5-18.5 LF-MCG/0.5 IM SUSY: 0.5000 mL | PREFILLED_SYRINGE | Freq: Once | INTRAMUSCULAR | Status: DC

## 2022-07-18 MED ORDER — BENZOCAINE-MENTHOL 20-0.5 % EX AERO: 1.0000 | INHALATION_SPRAY | CUTANEOUS | Status: DC | PRN

## 2022-07-18 MED ORDER — ONDANSETRON HCL 4 MG PO TABS: 4.0000 mg | ORAL_TABLET | ORAL | Status: DC | PRN

## 2022-07-18 MED ORDER — IBUPROFEN 600 MG PO TABS
600.0000 mg | ORAL_TABLET | Freq: Four times a day (QID) | ORAL | Status: DC
  Administered 2022-07-18 – 2022-07-20 (×7): 600 mg via ORAL
  Filled 2022-07-18 (×7): qty 1

## 2022-07-18 MED ORDER — OXYTOCIN BOLUS FROM INFUSION
333.0000 mL | Freq: Once | INTRAVENOUS | Status: AC
  Administered 2022-07-18: 333 mL via INTRAVENOUS

## 2022-07-18 MED ORDER — LACTATED RINGERS IV SOLN: INTRAVENOUS | Status: DC

## 2022-07-18 MED ORDER — OXYCODONE-ACETAMINOPHEN 5-325 MG PO TABS: 2.0000 | ORAL_TABLET | ORAL | Status: DC | PRN

## 2022-07-18 MED ORDER — ZOLPIDEM TARTRATE 5 MG PO TABS: 5.0000 mg | ORAL_TABLET | Freq: Every evening | ORAL | Status: DC | PRN

## 2022-07-18 MED ORDER — SOD CITRATE-CITRIC ACID 500-334 MG/5ML PO SOLN: 30.0000 mL | ORAL | Status: DC | PRN

## 2022-07-18 MED ORDER — SIMETHICONE 80 MG PO CHEW: 80.0000 mg | CHEWABLE_TABLET | ORAL | Status: DC | PRN

## 2022-07-18 MED ORDER — LIDOCAINE HCL (PF) 1 % IJ SOLN
INTRAMUSCULAR | Status: DC | PRN
  Administered 2022-07-18: 5 mL via EPIDURAL

## 2022-07-18 MED ORDER — MISOPROSTOL 50MCG HALF TABLET
50.0000 ug | ORAL_TABLET | Freq: Once | ORAL | Status: AC
  Administered 2022-07-18: 50 ug via ORAL
  Filled 2022-07-18: qty 1

## 2022-07-18 MED ORDER — MISOPROSTOL 50MCG HALF TABLET
50.0000 ug | ORAL_TABLET | Freq: Once | ORAL | Status: AC
  Administered 2022-07-18: 50 ug via VAGINAL
  Filled 2022-07-18: qty 1

## 2022-07-18 MED ORDER — ONDANSETRON HCL 4 MG/2ML IJ SOLN: 4.0000 mg | INTRAMUSCULAR | Status: DC | PRN

## 2022-07-18 MED ORDER — COCONUT OIL OIL: 1.0000 | TOPICAL_OIL | Status: DC | PRN

## 2022-07-18 MED ORDER — WITCH HAZEL-GLYCERIN EX PADS: 1.0000 | MEDICATED_PAD | CUTANEOUS | Status: DC | PRN

## 2022-07-18 NOTE — Anesthesia Procedure Notes (Signed)
Epidural Patient location during procedure: OB Start time: 07/18/2022 3:02 PM End time: 07/18/2022 3:15 PM  Staffing Anesthesiologist: Barnet Glasgow, MD Performed: anesthesiologist   Preanesthetic Checklist Completed: patient identified, IV checked, site marked, risks and benefits discussed, surgical consent, monitors and equipment checked, pre-op evaluation and timeout performed  Epidural Patient position: sitting Prep: DuraPrep and site prepped and draped Patient monitoring: continuous pulse ox and blood pressure Approach: midline Location: L3-L4 Injection technique: LOR air  Needle:  Needle type: Tuohy  Needle gauge: 17 G Needle length: 9 cm and 9 Needle insertion depth: 8 cm Catheter type: closed end flexible Catheter size: 19 Gauge Catheter at skin depth: 14 cm Test dose: negative  Assessment Events: blood not aspirated, injection not painful, no injection resistance, no paresthesia and negative IV test  Additional Notes Patient identified. Risks/Benefits/Options discussed with patient including but not limited to bleeding, infection, nerve damage, paralysis, failed block, incomplete pain control, headache, blood pressure changes, nausea, vomiting, reactions to medication both or allergic, itching and postpartum back pain. Confirmed with bedside nurse the patient's most recent platelet count. Confirmed with patient that they are not currently taking any anticoagulation, have any bleeding history or any family history of bleeding disorders. Patient expressed understanding and wished to proceed. All questions were answered. Sterile technique was used throughout the entire procedure. Please see nursing notes for vital signs. Test dose was given through epidural needle and negative prior to continuing to dose epidural or start infusion. Warning signs of high block given to the patient including shortness of breath, tingling/numbness in hands, complete motor block, or any  concerning symptoms with instructions to call for help. Patient was given instructions on fall risk and not to get out of bed. All questions and concerns addressed with instructions to call with any issues.  1 Attempt (S) . Patient tolerated procedure well.

## 2022-07-18 NOTE — Progress Notes (Signed)
Patient ID: Theresa Bernard, female   DOB: [birth year], 36 y.o.   MRN: [id number]  Feeling contractions - about q2-3 minutes  BP 112/60   Pulse 81   Temp 98 F (36.7 C) (Oral)   Resp 16   Ht 5\' 1"  (1.549 m)   Wt 99.2 kg   LMP 10/15/2021   BMI 41.34 kg/m   Dilation: 2.5 Effacement (%): 60, 70 Station: -2 Presentation: Vertex Exam by:: Dr. Nehemiah Settle  AROM with clear fluid. Category 1 tracing.  Laboring off cytotec at present - if contractions space out, then will add pitocin.  Truett Mainland, DO

## 2022-07-18 NOTE — Lactation Note (Signed)
This note was copied from a baby's chart. Lactation Consultation Note  Patient Name: Theresa Bernard [id number] Date: 07/18/2022 Reason for consult: L&D Initial assessment;Term;Other (Comment);Maternal discharge;Maternal endocrine disorder (LGA, AMA, [employer] employee) Age:36 hours  Visited with family of <1 hour old FT female, she's a P6 and experienced breastfeeding, she breastfed baby #4 for a few months (it was mostly pumping due to NICU stay) and baby # 5 for 10.5 months. Baby already nursing when entered the room (see LATCH score), this [place] witnessed the last 5 minutes of this feeding. Reviewed normal newborn behavior, feeding cues, cluster feeding, size of baby's stomach, benefits of STS care and newborn hypoglycemia.   Maternal Data Has patient been taught Hand Expression?: Yes Does the patient have breastfeeding experience prior to this delivery?: Yes How long did the patient breastfeed?: 10.5 months  Feeding Mother's Current Feeding Choice: Breast Milk  LATCH Score Latch: Repeated attempts needed to sustain latch, nipple held in mouth throughout feeding, stimulation needed to elicit sucking reflex. (required some stimulation)  Audible Swallowing: A few with stimulation (with compressions)  Type of Nipple: Everted at rest and after stimulation  Comfort (Breast/Nipple): Soft / non-tender  Hold (Positioning): Assistance needed to correctly position infant at breast and maintain latch.  LATCH Score: 7  Interventions Interventions: Breast feeding basics reviewed;Assisted with latch;Skin to skin;Breast compression;Adjust position  Plan of care Encouraged putting baby to breast STS 8-12 times/24 hours or sooner if feeding cues are present Breast massage and hand expression were also encouraged prior latching She'll let her RN know tomorrow once she had decided on her employee pump to page lactation for pump issuance  FOB and GOB (paternal) present and supportive. All questions and  concerns answered, family to contact [facility] services PRN  Discharge Pump:  (She's a [employer] employee and elgible for an employee pump)  Consult Status Consult Status: Follow-up from L&D Date: 07/19/22 Follow-up type: In-patient   Tinita Brooker Francene Boyers 07/18/2022, 6:39 PM

## 2022-07-18 NOTE — H&P (Signed)
OBSTETRIC ADMISSION HISTORY AND PHYSICAL  Theresa Bernard is a 36 y.o. female [phone] with IUP at [redacted]w[redacted]d presenting for IOL due to GDM and fetal macrosomia in the 96%. She reports +FMs, No LOF, no VB, no blurry vision, headaches or peripheral edema, and RUQ pain.  She plans on breast and bottle feeding. She request BTL for birth control. She received her prenatal care at [facility]: By LMP --->  Estimated Date of Delivery: 07/22/22  Sono:    8/30 $Rem'@[redacted]w[redacted]d'pcAN$ , CWD, normal anatomy, cephalic presentation, 3299M, 96% EFW   Prenatal History/Complications:  Patient Active Problem List   Diagnosis Date Noted   History of macrosomia in infant in prior pregnancy, currently pregnant in third trimester 06/08/2022   Obesity in pregnancy, antepartum, third trimester 06/08/2022   BMI 40.0-44.9, adult ([place]) 06/08/2022   HPV (human papilloma virus) infection 06/08/2022   Anemia during pregnancy in third trimester 06/08/2022   Gestational diabetes, diet controlled 05/19/2022   AMA (advanced maternal age) multigravida 35+ 05/07/2022   Supervision of other normal pregnancy, antepartum 01/08/2022    Nursing Staff Provider  Office Location  [facility] Dating  LMP  [facility] Model Valu.Nieves ] Traditional $RemoveBefor'[ ]'KHSCpdqQztHt$  Centering $RemoveBe'[ ]'KlCjKBPhi$  Mom-Baby Dyad    Language  English Anatomy US    Flu Vaccine  Received 08/2021 Genetic/Carrier Screen  NIPS:   Low risks AFP:    Horizon: neg x 4  TDaP Vaccine   05-07-22 Hgb A1C or  GTT Early  Third trimester   COVID Vaccine Completed   LAB RESULTS   Rhogam   Blood Type O/Positive/-- (03/10 1005)   Baby Feeding Plan Both Antibody Negative (03/10 1005)  Contraception BTL Rubella 5.82 (03/10 1005)  Circumcision Yes if a boy RPR Non Reactive (07/07 1031)   Pediatrician  Undecided HBsAg Negative (03/10 1005)   Support Person Husband HCVAb Negative  Prenatal Classes  HIV Non Reactive (07/07 1031)     BTL Consent  signed 05/07/2022 GBS Negative/-- (08/29 0906) (For PCN allergy, check sensitivities)    VBAC Consent  Pap        DME Rx Valu.Nieves ] BP cuff [ X] Weight Scale Waterbirth  $RemoveBef'[ ]'IHyVbrHtux$  Class $Remo'[ ]'WJDff$  Consent $Remove'[ ]'ipUQfVU$  CNM visit  PHQ9 & GAD7 [ X ] new OB [ X ] 28 weeks  [  ] 36 weeks Induction  $RemoveBe'[ ]'lFkUQYVLb$  Orders Entered $RemoveBefore'[ ]'ngIScVqaiZIso$ Foley Y/N    Past Medical History: Past Medical History:  Diagnosis Date   Anxiety    Depression     Past Surgical History: Past Surgical History:  Procedure Laterality Date   TONSILLECTOMY  32   WISDOM TOOTH EXTRACTION      Obstetrical History: OB History     Gravida  7   Para  5   Term  5   Preterm      AB  1   Living  5      SAB      IAB  1   Ectopic      Multiple      Live Births  5        Obstetric Comments  Never had GDM with pre-2023 pregnancies 2018: 9lbs 13oz Other babies were b/w 8 to 9 1/2 lbs.  No issues with any deliveries         Social History Social History   Socioeconomic History   Marital status: Married    Spouse name: Not on file   Number of children: Not on file  Years of education: Not on file   Highest education level: Not on file  Occupational History   Not on file  Tobacco Use   Smoking status: Never   Smokeless tobacco: Never  Vaping Use   Vaping Use: Never used  Substance and Sexual Activity   Alcohol use: Not Currently    Comment: 1 x per week prior to pregnancy   Drug use: Never   Sexual activity: Yes    Partners: Male    Birth control/protection: None  Other Topics Concern   Not on file  Social History Narrative   Not on file   Social Determinants of Health   Financial Resource Strain: Not on file  Food Insecurity: No Food Insecurity (07/18/2022)   Hunger Vital Sign    Worried About Running Out of Food in the Last Year: Never true    Ran Out of Food in the Last Year: Never true  Transportation Needs: No Transportation Needs (07/18/2022)   PRAPARE - Hydrologist (Medical): No    Lack of Transportation (Non-Medical): No  Physical Activity: Not on file  Stress:  Not on file  Social Connections: Not on file    Family History: Family History  Problem Relation Age of Onset   Heart murmur Mother    COPD Maternal Grandmother    Stomach cancer Maternal Grandfather     Allergies: No Known Allergies  Medications Prior to Admission  Medication Sig Dispense Refill Last Dose   Blood Glucose Monitoring Suppl (FREESTYLE LITE) w/Device KIT 1 Device by Does not apply route in the morning, at noon, in the evening, and at bedtime. Check capillary blood glucose 4 times daily. 1 kit 0 Past Week   Blood Pressure Monitoring (BLOOD PRESSURE KIT) DEVI 1 kit by Does not apply route once a week. 1 each 0 Past Week   ferrous sulfate (FERROUSUL) 325 (65 FE) MG tablet Take 1 tablet (325 mg total) by mouth 2 (two) times daily. 60 tablet 1 Past Week   glucose blood (FREESTYLE LITE) test strip Check capillary blood sugars 4 times daily. 100 each 12 Past Week   Lancets (FREESTYLE) lancets Check capillary blood glucose 4 times daily. 100 each 12 Past Week   Prenatal MV & Min w/FA-DHA (PRENATAL GUMMIES PO) Take by mouth.   Past Week   aspirin 81 MG chewable tablet Chew 81 mg by mouth daily.   More than a month     Review of Systems   All systems reviewed and negative except as stated in HPI  Blood pressure 139/71, pulse 87, temperature 98.9 F (37.2 C), temperature source Oral, resp. rate 18, height $RemoveBe'5\' 1"'CTuEznVME$  (1.549 m), weight 99.2 kg, last menstrual period 10/15/2021. General appearance: alert, cooperative, and no distress Lungs: clear to auscultation bilaterally Heart: regular rate and rhythm Abdomen: soft, non-tender; bowel sounds normal Pelvic:   Extremities: Homans sign is negative, no sign of DVT Presentation: cephalic Fetal monitoringBaseline: 140 bpm, Variability: Good {> 6 bpm), Accelerations: Reactive, and Decelerations: Absent Uterine activityNone     Prenatal labs: ABO, Rh: O/Positive/-- (03/10 1005) Antibody: Negative (03/10 1005) Rubella: 5.82  (03/10 1005) RPR: Non Reactive (07/07 1031)  HBsAg: Negative (03/10 1005)  HIV: Non Reactive (07/07 1031)  GBS: Negative/-- (08/29 0906)  2 hr Glucola - elevated Genetic screening  LR Anatomy US normal  Prenatal Transfer Tool  Maternal Diabetes: Yes:  Diabetes Type:  Diet controlled Genetic Screening: Normal Maternal Ultrasounds/Referrals: Normal Fetal Ultrasounds or other Referrals:  None Maternal Substance Abuse:  No Significant Maternal Medications:  None Significant Maternal Lab Results:  Group B Strep negative Number of Prenatal Visits:greater than 3 verified prenatal visits Other Comments:  None  No results found for this or any previous visit (from the past 24 hour(s)).  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 G7P5015 at [redacted]w[redacted]d here for IOL due to GDM A1 with fetal macrosomia, AMA  #Labor: Cytotec #Pain: Plans on  #ID:  GBS neg #MOF: Breast/bottle #MOC:BTL #Circ:  N/a  Truett Mainland, DO  07/18/2022, 9:48 AM

## 2022-07-18 NOTE — Discharge Summary (Signed)
Postpartum Discharge Summary  Date of Service updated***     Patient Name: Theresa Bernard DOB: 25-Feb-1986 MRN: [id number]  Date of admission: 07/18/2022 Delivery date:07/18/2022  Delivering provider: Truett Mainland  Date of discharge: 07/18/2022  Admitting diagnosis: Gestational diabetes, diet controlled [O24.410] Intrauterine pregnancy: [redacted]w[redacted]d    Secondary diagnosis:  Principal Problem:   Gestational diabetes, diet controlled Active Problems:   Supervision of other normal pregnancy, antepartum   AMA (advanced maternal age) multigravida 35+   History of macrosomia in infant in prior pregnancy, currently pregnant in third trimester   Obesity in pregnancy, antepartum, third trimester   Anemia during pregnancy in third trimester  Additional problems: none    Discharge diagnosis: Term Pregnancy Delivered and GDM A1                                              Post partum procedures:postpartum tubal ligation Augmentation: AROM and Cytotec Complications: None  Hospital course: Induction of Labor With Vaginal Delivery   36y.o. yo [id number] admitted to the hospital 07/18/2022 for induction of labor.  Indication for induction: A1 DM.  Patient had an uncomplicated labor course as follows: Membrane Rupture Time/Date: 1:55 PM ,07/18/2022   Delivery Method:Vaginal, Spontaneous  Episiotomy: None  Lacerations:  None  Details of delivery can be found in separate delivery note.  Patient had a routine postpartum course. Patient is discharged home 07/18/22.  Newborn Data: Birth date:07/18/2022  Birth time:5:54 PM  Gender:Female  Living status:Living  Apgars:9 ,10  Weight:   Magnesium Sulfate received: No BMZ received: No Rhophylac:N/A MMR:{MMR:30440033} T-DaP:Given prenatally Flu: No Transfusion:No  Physical exam  Vitals:   07/18/22 1600 07/18/22 1630 07/18/22 1800 07/18/22 1815  BP: 120/60 111/77 113/76 118/67  Pulse: 85 92 (!) 106 (!) 107  Resp: _0 Temp:  98 F (36.7 C)  98.5 F (36.9 C)   TempSrc: Oral  Oral   SpO2: 99%     Weight:      Height:       General: {Exam; general:21111117} Lochia: {Desc; appropriate/inappropriate:30686::"appropriate"} Uterine Fundus: {Desc; firm/soft:30687} Incision: {Exam; incision:21111123} DVT Evaluation: {Exam; dvGEZ:6629476}abs: Lab Results  Component Value Date   WBC 10.6 (H) 07/18/2022   HGB 9.5 (L) 07/18/2022   HCT 28.6 (L) 07/18/2022   MCV 83.1 07/18/2022   PLT 196 07/18/2022       No data to display         Edinburgh Score:     No data to display           After visit meds:  Allergies as of 07/18/2022   No Known Allergies   [facility] completed [facility]        Discharge home in stable condition Infant Feeding: Bottle and Breast Infant Disposition:{CHL IP OB HOME WITH MOLYYTKP:54656}ischarge instruction: per After Visit Summary and Postpartum booklet. Activity: Advance as tolerated. Pelvic rest for 6 weeks.  Diet: {OB diCLEX:51700174}uture Appointments:No future appointments. Follow up Visit:  (Sent) Please schedule this patient for a In person postpartum visit in 4 weeks with the following provider: Any provider. Additional Postpartum F/U: none   High risk pregnancy complicated by: GDM Delivery mode:  Vaginal, Spontaneous  Anticipated Birth Control:  [facility] JaTruett MainlandDO

## 2022-07-18 NOTE — Anesthesia Preprocedure Evaluation (Signed)
Anesthesia Evaluation  Patient identified by MRN, date of birth, ID band Patient awake    Reviewed: Allergy & Precautions, NPO status , Patient's Chart, lab work & pertinent test results  Airway Mallampati: III  TM Distance: >3 FB Neck ROM: Full    Dental no notable dental hx. (+) Teeth Intact, Dental Advisory Given   Pulmonary    Pulmonary exam normal breath sounds clear to auscultation       Cardiovascular Normal cardiovascular exam Rhythm:Regular Rate:Normal     Neuro/Psych    GI/Hepatic   Endo/Other  diabetes, Gestational  Renal/GU      Musculoskeletal   Abdominal (+) + obese (BMI 41.3),   Peds  Hematology  (+) Blood dyscrasia, anemia , Lab Results      Component                Value               Date                      WBC                      10.6 (H)            07/18/2022                HGB                      9.5 (L)             07/18/2022                HCT                      28.6 (L)            07/18/2022                MCV                      83.1                07/18/2022                PLT                      196                 07/18/2022              Anesthesia Other Findings   Reproductive/Obstetrics (+) Pregnancy                             Anesthesia Physical Anesthesia Plan  ASA: 3  Anesthesia Plan: Epidural   Post-op Pain Management:    Induction:   PONV Risk Score and Plan:   Airway Management Planned:   Additional Equipment:   Intra-op Plan:   Post-operative Plan:   Informed Consent: I have reviewed the patients History and Physical, chart, labs and discussed the procedure including the risks, benefits and alternatives for the proposed anesthesia with the patient or authorized representative who has indicated his/her understanding and acceptance.       Plan Discussed with:   Anesthesia Plan Comments: (39.3 G7P5 w gDm BMI 41.3 and Fetal  macrosomia for LEA)  Anesthesia Quick Evaluation  

## 2022-07-19 ENCOUNTER — Encounter (HOSPITAL_COMMUNITY)
Admission: AD | Disposition: A | Discharge status: Home / Self Care | Payer: Self-pay | Source: Home / Self Care | Attending: Family Medicine

## 2022-07-19 LAB — GLUCOSE, CAPILLARY: Glucose-Capillary: 72 mg/dL (ref 70–99)

## 2022-07-19 SURGERY — LIGATION, FALLOPIAN TUBE, POSTPARTUM
Anesthesia: Choice | Laterality: Bilateral

## 2022-07-19 NOTE — Progress Notes (Addendum)
POSTPARTUM PROGRESS NOTE  Post Partum Day 1  Subjective: Theresa Bernard is a 36 y.o. O1Y0737 s/p SVD at [redacted]w[redacted]d.  She reports she is doing well. No acute events overnight. She denies any problems with ambulating, voiding or po intake. Denies nausea or vomiting. She has passed flatus. She has not had bowel movement. Pain is well controlled.  Lochia is Minimal.  Denies headache, scotomata, RUQ pain, dizziness, dyspnea, and chest palpitation  Objective: BP 130/80   Pulse 83   Temp 97.8 F (36.6 C) (Oral)   Resp 18   Ht 5\' 1"  (1.549 m)   Wt 99.2 kg   LMP 10/15/2021   SpO2 99%   Breastfeeding Unknown   BMI 41.34 kg/m   Physical Exam:  General: alert, cooperative and no distress Chest: CTAB, no respiratory distress Abdomen: soft, non-tender  Uterine Fundus: firm, appropriately tender Extremities: Some calf swelling, no tenderness or edema.   Recent Labs    07/18/22 0933  HGB 9.5*  HCT 28.6*    Assessment/Plan: Theresa Bernard is a 36 y.o. T0G2694 s/p VD at [redacted]w[redacted]d for IOL LGA.   PPD#1: Doing well, pain well-controlled.  -- Routine postpartum care, lactation support -- Encouraged up OOB -- Contraception: bilateral tubal ligation outpatient  -- Feeding: breast feeding -- Circumcision: N/A -- A1GDM: well controlled.   Dispo: Plan for discharge tommorow.   LOS: 1 day   Glorious Peach, Medical Student 07/19/2022, 8:02 AM   Midwife attestation I have seen and examined this patient and agree with above documentation in the student's note.   Post Partum Day 1  Theresa Bernard is a 36 y.o. W5I6270 s/p SVD.  Pt denies problems with ambulating, voiding or po intake. Pain is well controlled. Method of Feeding: bottle  PE:  Gen: well appearing Heart: reg rate Lungs: normal WOB Fundus firm Ext: soft, no pain, no edema  Assessment: S/p SVD PPD #1  Plan for discharge: tomorrow Cancel BTL today per pt request (wants epidural cath out and wants to eat) Plan outpt  BTL  Julianne Handler, CNM 8:36 AM

## 2022-07-19 NOTE — Lactation Note (Signed)
This note was copied from a baby's chart. Lactation Consultation Note  Patient Name: Girl Amey Hossain BFXOV'A Date: 07/19/2022 Reason for consult: Follow-up assessment Age:36 hours  P6, Observed latch.  Provided mother with freestyle employee pump.  Mother denies questions or concerns.  Will call if help is needed.  Discharge Pump: Employee Pump  Consult Status Consult Status: PRN    Carlye Grippe 07/19/2022, 12:58 PM

## 2022-07-19 NOTE — Progress Notes (Signed)
Informed patient that her BTL was scheduled for 1630 this evening per OR schedule. Patient had previously been told that it would be for 1100 this morning and patient has been NPO since midnight. Also informed patient that c-sections would be performed prior to her BTL if emergencies came up along with the multiple scheduled c-sections. Patient states she would like to eat and most likely reschedule her BTL for an outpatient procedure. Notified Julianne Handler, CNM, who had just spoken with patient after this RN left the room. Melanie ordered a regular diet for patient. Maxwell Caul, Leretha Dykes [place]

## 2022-07-19 NOTE — Anesthesia Postprocedure Evaluation (Signed)
Anesthesia Post Note  Patient: Chief Strategy Officer  Procedure(s) Performed: AN AD HOC LABOR EPIDURAL     Patient location during evaluation: Mother Baby Anesthesia Type: Epidural Level of consciousness: awake, oriented and awake and alert Pain management: pain level controlled Vital Signs Assessment: post-procedure vital signs reviewed and stable Respiratory status: spontaneous breathing, respiratory function stable and nonlabored ventilation Postop Assessment: no headache, adequate PO intake, able to ambulate, patient able to bend at knees and no apparent nausea or vomiting Anesthetic complications: no   No notable events documented.  Last Vitals:  Vitals:   07/19/22 0511 07/19/22 0950  BP: 130/80 130/73  Pulse: 83 81  Resp: 18 20  Temp: 36.6 C 37 C  SpO2: 99% 98%    Last Pain:  Vitals:   07/19/22 1157  TempSrc:   PainSc: 0-No pain   Pain Goal:                   Theresa Bernard

## 2022-07-20 LAB — GLUCOSE, CAPILLARY: Glucose-Capillary: 83 mg/dL (ref 70–99)

## 2022-07-20 MED ORDER — IBUPROFEN 600 MG PO TABS: 600.0000 mg | ORAL_TABLET | Freq: Four times a day (QID) | ORAL | 0 refills | Status: DC

## 2022-07-20 MED ORDER — FERROUS SULFATE 325 (65 FE) MG PO TABS: 325.0000 mg | ORAL_TABLET | ORAL | 0 refills | Status: AC

## 2022-07-20 NOTE — Progress Notes (Signed)
Post Partum Day 2 Subjective: no complaints, up ad lib, voiding, tolerating PO, and + flatus  Objective: Blood pressure 127/84, pulse 88, temperature 98.4 F (36.9 C), temperature source Oral, resp. rate 18, height 5\' 1"  (1.549 m), weight 99.2 kg, last menstrual period 10/15/2021, SpO2 96 %, unknown if currently breastfeeding.  Physical Exam:  General: alert and cooperative Lochia: appropriate Uterine Fundus: firm DVT Evaluation: Calf/Ankle edema is present.  Recent Labs    07/18/22 0933  HGB 9.5*  HCT 28.6*    Assessment/Plan: Doing well, pain controlled. Breastfeeding, and Contraception; outpatient [place] Discharge home   LOS: 2 days   Hollace Hayward, Student-MidWife 07/20/2022, 6:41 AM

## 2022-07-20 NOTE — Social Work (Signed)
CSW received consult for hx of Anxiety and Depression.  CSW met with MOB to offer support and complete assessment.    CSW met with MOB at bedside and introduced CSW role. CSW observed MOB sitting on couch next to her young child and FOB sitting on bed consoling the crying infant. CSW offered MOB privacy. MOB gave CSW permission to share all information with her family present. CSW required how MOB has felt since giving birth. MOB reported she has been feeling good and shared the L&D went well.  MOB acknowledged she has a history of depression and anxiety diagnosed a couple years ago. MOB reported to treat symptoms she took Sertraline in the past and Hydroxyzine PRN. MOB expressed she manages well without the medications and has learned to "just breath." CSW discussed PPD/PPA. MOB stated that she is knowledgeable of PPD/PPA. CSW provided education regarding the baby blues period vs. perinatal mood disorders, discussed treatment and gave resources for mental health follow up if concerns arise.  CSW recommended MOB complete a self-evaluation during the postpartum time period using the New Mom Checklist from Postpartum Progress and encouraged MOB to contact a medical professional if symptoms are noted at any time. CSW assessed MOB for safety. MOB denied thoughts of harm to self and others. MOB identified her spouse as a primary support.   MOB reported she has all essential items to care for the infant. MOB has chosen [facility] for the infant follow up care. CSW provided review of Sudden Infant Death Syndrome (SIDS) precautions. CSW assessed MOB for additional needs. MOB reported no further need.   CSW identifies no further need for intervention and no barriers to discharge at this time.   Kathrin Greathouse, MSW, LCSW [facility] and [place] Worker  [phone] 07/20/2022  2:46 PM

## 2022-07-27 ENCOUNTER — Telehealth (HOSPITAL_COMMUNITY): Payer: Self-pay | Admitting: *Deleted

## 2022-07-27 NOTE — Telephone Encounter (Signed)
Mom reports feeling good. No concerns about herself at this time. EPDS=0 [facility] score=0) Mom reports baby is doing well. Feeding, peeing, and pooping without difficulty. Safe sleep reviewed. Mom reports no concerns about baby at present.  Odis Hollingshead, RN 07-27-2022 at 11:04am

## 2022-07-30 IMAGING — US US MFM OB DETAIL+14 WK
1 series · 13 of 28 positions shown · non-contrast
Comparison: none

[Series 1: us mfm ob detail+14 wk · 115 acquisitions, 13 frames shown]
[im 5/115]
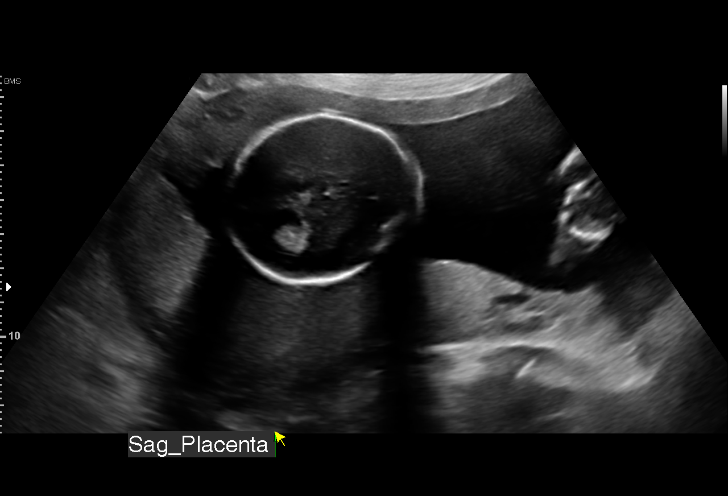
[im 13/115]
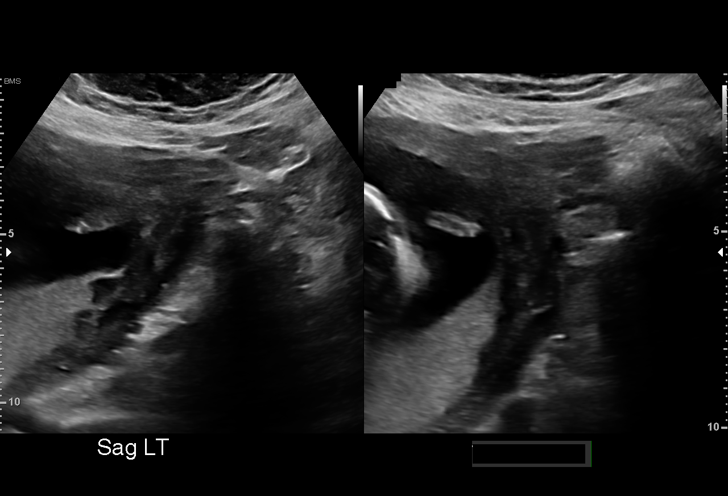
[im 22/115]
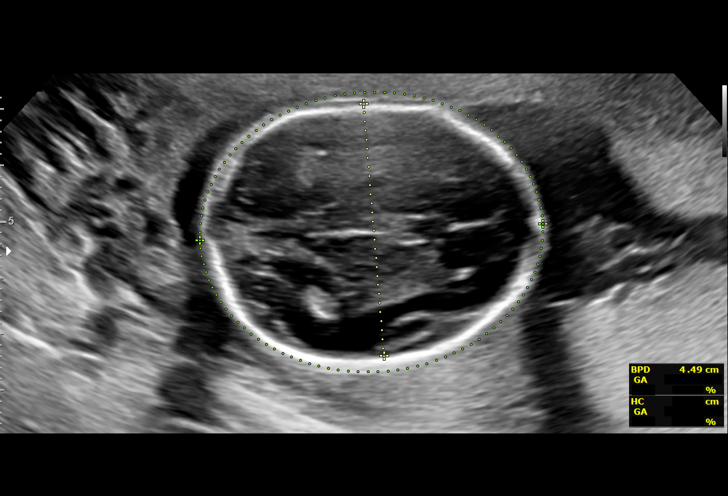
[im 30/115]
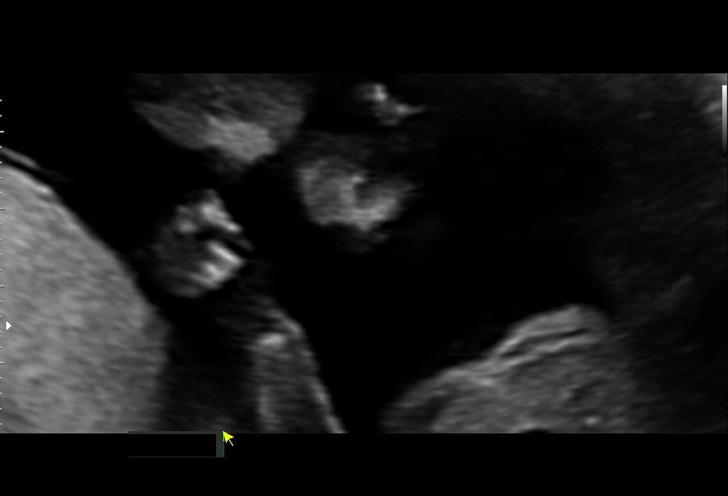
[im 39/115]
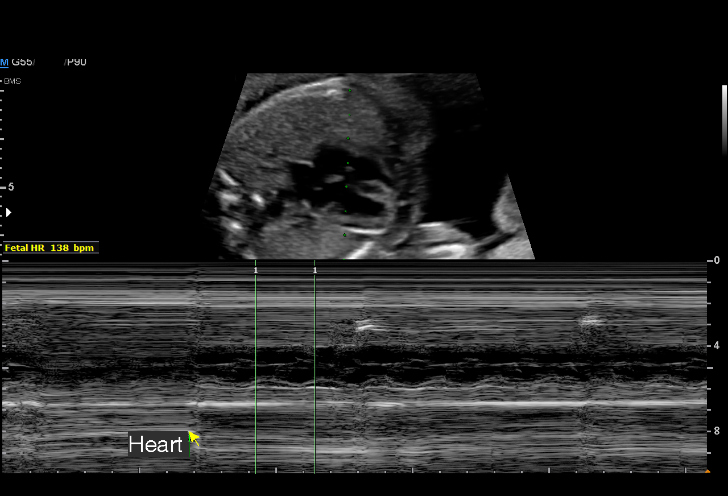
[im 47/115]
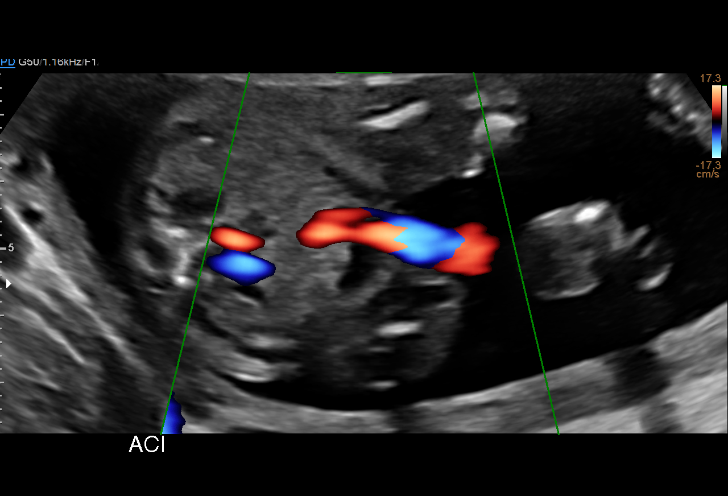
[im 60/115]
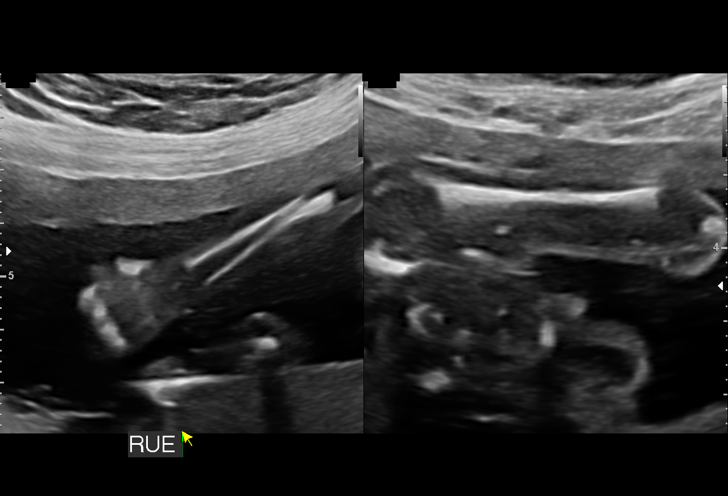
[im 68/115]
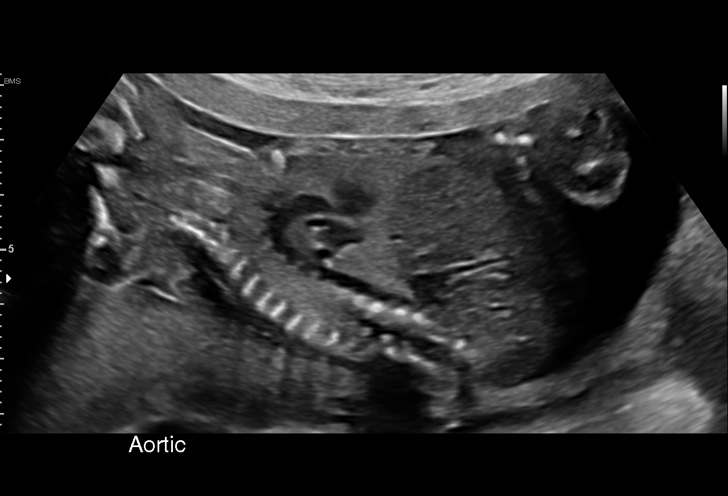
[im 77/115]
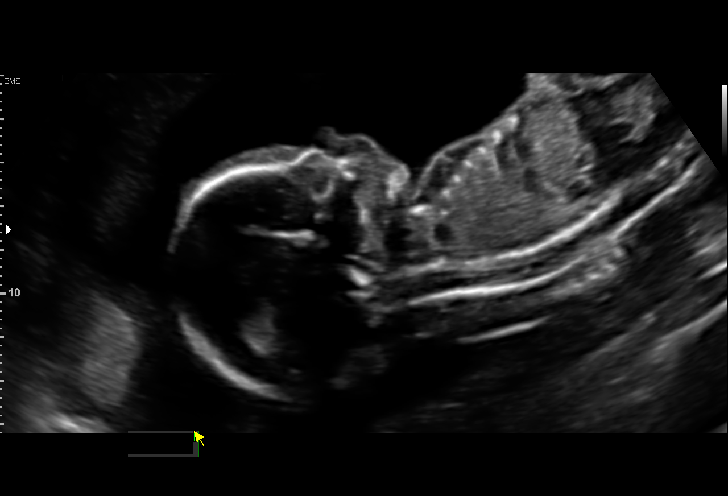
[im 85/115]
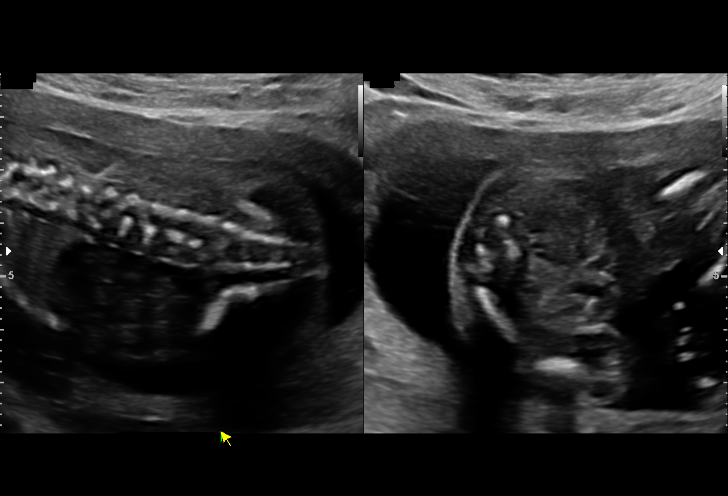
[im 93/115]
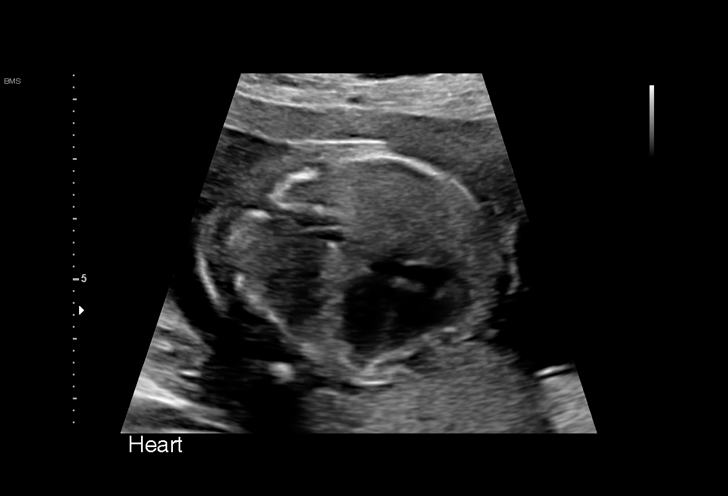
[im 102/115]
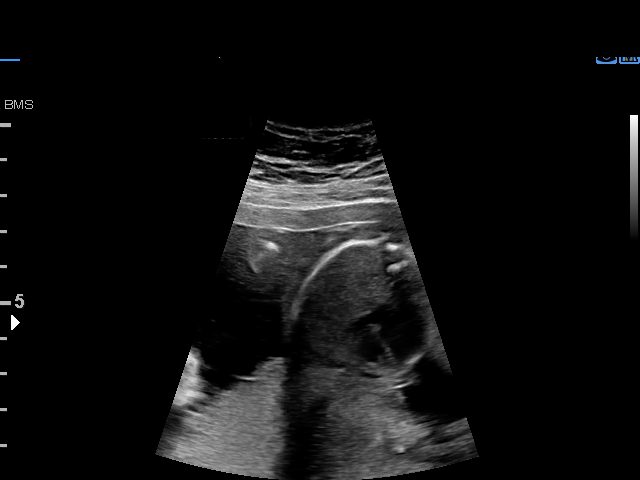
[im 110/115]
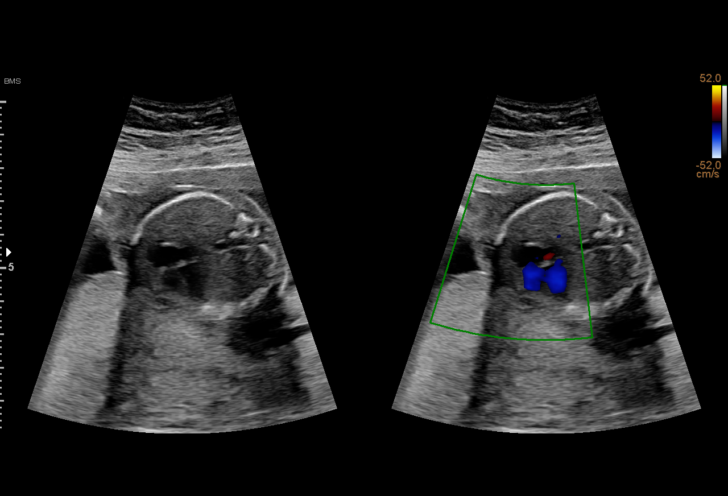

[13 of 28 positions shown; findings below may reference images not displayed]

[REDACTED]care
                   WALKER CNM

Indications

 Advanced maternal age multigravida 35+,
 second trimester (35)
 Obesity complicating pregnancy, second
 trimester (BMI 38)
 Encounter for antenatal screening for
 malformations
 LR NIPS, Neg AFP, Neg Horizon
 Medical complication of pregnancy (HPV)
 19 weeks gestation of pregnancy
Fetal Evaluation

 Num Of Fetuses:         1
 Fetal Heart Rate(bpm):  138
 Cardiac Activity:       Observed
 Presentation:           Breech
 Placenta:               Posterior Fundal
 P. Cord Insertion:      Visualized, central

 Amniotic Fluid
 AFI FV:      Within normal limits

                             Largest Pocket(cm)

Biometry
 BPD:      45.9  mm     G. Age:  19w 6d         51  %    CI:         69.8   %    70 - 86
                                                         FL/HC:      17.2   %    16.8 -
 HC:      175.3  mm     G. Age:  20w 0d         51  %    HC/AC:      1.14        1.09 -
 AC:      154.2  mm     G. Age:  20w 4d         70  %    FL/BPD:     65.8   %
 FL:       30.2  mm     G. Age:  19w 2d         24  %    FL/AC:      19.6   %    20 - 24
 HUM:      29.7  mm     G. Age:  19w 5d         50  %
 CER:      22.4  mm     G. Age:  21w 0d         93  %
 NFT:       5.0  mm
 LV:        7.8  mm
 CM:        4.7  mm

 Est. FW:     327  gm    0 lb 12 oz      55  %
OB History

 Gravidity:    7         Term:   5
 TOP:          1        Living:  5
Gestational Age

 LMP:           19w 5d        Date:  10/15/21                 EDD:   07/22/22
 U/S Today:     20w 0d                                        EDD:   07/20/22
 Best:          19w 6d     Det. By:  U/S C R L  (01/08/22)    EDD:   07/21/22
Anatomy

 Cranium:               Appears normal         Aortic Arch:            Appears normal
 Cavum:                 Appears normal         Ductal Arch:            Previously seen
 Ventricles:            Appears normal         Diaphragm:              Appears normal
 Choroid Plexus:        Appears normal         Stomach:                Appears normal, left
                                                                       sided
 Cerebellum:            Appears normal         Abdomen:                Appears normal
 Posterior Fossa:       Appears normal         Abdominal Wall:         Appears nml (cord
                                                                       insert, abd wall)
 Nuchal Fold:           Appears normal         Cord Vessels:           Appears normal (3
                                                                       vessel cord)
 Face:                  Appears normal         Kidneys:                Appear normal
                        (orbits and profile)
 Lips:                  Appears normal         Bladder:                Appears normal
 Thoracic:              Appears normal         Spine:                  Appears normal
 Heart:                 Not well visualized    Upper Extremities:      Appears normal
 RVOT:                  Not well visualized    Lower Extremities:      Appears normal
 LVOT:                  Appears normal

 Other:  Heels/feet and open hands/5th digits visualized. Fetus appears to be
         female. 3VT visualized.
Cervix Uterus Adnexa

 Cervix
 Length:           3.04  cm.
 Normal appearance by transabdominal scan.

 Uterus
 No abnormality visualized.
 Right Ovary
 Within normal limits.

 Left Ovary
 Not visualized.

 Cul De Sac
 No free fluid seen.

 Adnexa
 No adnexal mass visualized.
Impression

 Single intrauterine pregnancy here for a detailed anatomy
 due to elevated BMI and AMA
 Normal anatomy with measurements consistent with dates
 There is good fetal movement and amniotic fluid volume
 Suboptimal views of the fetal anatomy were obtained
 secondary to fetal position.
Recommendations

 Follow up growth 4 weeks.

## 2022-08-11 ENCOUNTER — Encounter: Payer: Self-pay | Admitting: Obstetrics and Gynecology

## 2022-08-11 ENCOUNTER — Ambulatory Visit (INDEPENDENT_AMBULATORY_CARE_PROVIDER_SITE_OTHER): Payer: 59 | Admitting: Obstetrics and Gynecology

## 2022-08-11 ENCOUNTER — Institutional Professional Consult (permissible substitution): Payer: 59 | Admitting: Obstetrics and Gynecology

## 2022-08-11 DIAGNOSIS — Z3009 Encounter for other general counseling and advice on contraception: Secondary | ICD-10-CM

## 2022-08-11 NOTE — Progress Notes (Signed)
Ms Damron present to discuss BTL. BTL papers signed 05/07/22  PE AF VSS Lungs clear Heart RRR Abd soft + BS  A/P Unwanted fertility  Patient desires bilateral tubal sterilization.  Other reversible forms of contraception were discussed with patient; she declines all other modalities. Discussed bilateral tubal sterilization in detail; discussed options of laparoscopic bilateral tubal sterilization using Filshie clips vs laparoscopic bilateral salpingectomy. Risks and benefits discussed in detail including but not limited to: risk of regret, permanence of method, bleeding, infection, injury to surrounding organs and need for additional procedures.  Failure risk of 1-2 % for Filshie clips and <1% for bilateral salpingectomy with increased risk of ectopic gestation if pregnancy occurs was also discussed with patient.  Also discussed possible reduction of risk of ovarian cancer via bilateral salpingectomy given that a growing body of knowledge reveals that the majority of cases of high grade serous "ovarian" cancer actually are actually  cancers arising from the fimbriated end of the fallopian tubes. Emphasized that removal of fallopian tubes do not result in any known hormonal imbalance.  Patient verbalized understanding of these risks and benefits and wants to proceed with sterilization with laparoscopic bilateral sterilization bilateral salpingectomy.   She was told that she will be contacted by our surgical scheduler regarding the time and date of her surgery; routine preoperative instructions of having nothing to eat or drink after midnight on the day prior to surgery and also coming to the hospital 1 1/2 hours prior to her time of surgery were also emphasized.  She was told she may be called for a preoperative appointment about a week prior to surgery and will be given further preoperative instructions at that visit.  Routine postoperative instructions will be reviewed with the patient and her family in  detail after surgery. Printed patient education handouts about the procedure was given to the patient to review at home.  Medicaid papers have been signed

## 2022-08-11 NOTE — Patient Instructions (Signed)
Laparoscopic Tubal Ligation, Care After The following information offers guidance on how to care for yourself after your procedure. Your health care provider may also give you more specific instructions. If you have problems or questions, contact your health care provider. What can I expect after the procedure? After the procedure, it is common to have: A sore throat if general anesthesia was used. Pain in shoulders, back, and abdomen. This is caused by the gas that was used during the procedure. Mild discomfort or cramping in your abdomen. Pain or soreness in the area where the surgical incision was made. A bloated feeling. Tiredness. Nausea and vomiting. Follow these instructions at home: Medicines Take over-the-counter and prescription medicines only as told by your health care provider. Ask your health care provider if the medicine prescribed to you: Requires you to avoid driving or using heavy machinery. Can cause constipation. You may need to take these actions to prevent or treat constipation: Drink enough fluid to keep your urine pale yellow. Take over-the-counter or prescription medicines. Eat foods that are high in fiber, such as beans, whole grains, and fresh fruits and vegetables. Limit foods that are high in fat and processed sugars, such as fried or sweet foods. Do not take aspirin because it can cause bleeding. Incision care     Follow instructions from your health care provider about how to take care of your incision. Make sure you: Wash your hands with soap and water for at least 20 seconds before and after you change your bandage (dressing). If soap and water are not available, use hand sanitizer. Change your dressing as told by your health care provider. Leave stitches (sutures), skin glue, or adhesive strips in place. These skin closures may need to stay in place for 2 weeks or longer. If adhesive strip edges start to loosen and curl up, you may trim the loose edges.  Do not remove adhesive strips completely unless your health care provider tells you to do that. Check your incision area every day for signs of infection. Check for: Redness, swelling, or more pain. Fluid or blood. Warmth. Pus or a bad smell. Activity Rest as told by your health care provider. Avoid sitting for a long time without moving. Get up to take short walks every 1-2 hours. This is important to improve blood flow and breathing. Ask for help if you feel weak or unsteady. Do not have sex, douche, or put a tampon or anything else in your vagina for 6 weeks or as long as told by your health care provider. Do not lift anything that is heavier than your baby for 2 weeks, or the limit that you are told, until your health care provider says that it is safe. Do not take baths, swim, or use a hot tub until your health care provider approves. Ask your health care provider if you may take showers. You may only be allowed to take sponge baths. Return to your normal activities as told by your health care provider. Ask your health care provider what activities are safe for you. General instructions After the procedure you may need to wear a sanitary pad for vaginal discharge. Have someone help you with your daily household tasks for the first few days. Keep all follow-up visits. This is important. Contact a health care provider if: You have redness, swelling, or more pain around your incision. Your incision feels warm to the touch. You have pus or a bad smell coming from your incision. The edges of your  incision break open after the sutures have been removed. Your pain does not improve after 2-3 days. You have a rash. You repeatedly become dizzy or light-headed. Your pain medicine is not helping. Get help right away if: You have a fever or chills. You faint. You have increasing pain in your abdomen. You have severe pain in one or both of your shoulders. You have fluid or blood coming from  your sutures or heavy bleeding from your vagina. You have shortness of breath or difficulty breathing. You have chest pain, leg pain, or leg swelling. You have ongoing nausea, vomiting, or diarrhea. These symptoms may represent a serious problem that is an emergency. Do not wait to see if the symptoms will go away. Get medical help right away. Call your local emergency services (911 in the [place] Do not drive yourself to the hospital. Summary After the procedure, it is common to have mild discomfort or cramping in your abdomen. After the procedure you may need to wear a sanitary pad for vaginal discharge. Take over-the-counter and prescription medicines only as told by your health care provider. Watch for symptoms that should prompt you to call your health care provider. Keep all follow-up visits. This is important. This information is not intended to replace advice given to you by your health care provider. Make sure you discuss any questions you have with your health care provider. Document Revised: 07/04/2020 Document Reviewed: 07/04/2020 Elsevier Patient Education  [place]. Laparoscopic Tubal Ligation Laparoscopic tubal ligation is a procedure to close the fallopian tubes. This is done to prevent pregnancy. When the fallopian tubes are closed, the eggs that your ovaries release cannot enter the uterus, and sperm cannot reach the released eggs. You should not have this procedure if you want to get pregnant someday or if you are unsure about having more children. Tell a health care provider about: Any allergies you have. All medicines you are taking, including vitamins, herbs, eye drops, creams, and over-the-counter medicines. Any problems you or family members have had with anesthetic medicines. Any blood disorders you have. Any surgeries you have had. Any medical conditions you have. Whether you are pregnant or may be pregnant. Any past pregnancies. What are the  risks? Generally, this is a safe procedure. However, problems may occur, including: Infection. Bleeding. Injury to other organs in the abdomen. Side effects from anesthetic medicines. Failure of the procedure. This procedure can increase your risk of an ectopic pregnancy. This is a pregnancy in which a fertilized egg attaches to the outside of the uterus. What happens before the procedure? Staying hydrated Follow instructions from your health care provider about hydration, which may include: Up to 2 hours before the procedure - you may continue to drink clear liquids, such as water, clear fruit juice, black coffee, and plain tea. Eating and drinking restrictions Follow instructions from your health care provider about eating and drinking, which may include: 8 hours before the procedure - stop eating heavy meals or foods, such as meat, fried foods, or fatty foods. 6 hours before the procedure - stop eating light meals or foods, such as toast or cereal. 6 hours before the procedure - stop drinking milk or drinks that contain milk. 2 hours before the procedure - stop drinking clear liquids. Medicines Ask your health care provider about: Changing or stopping your regular medicines. This is especially important if you are taking diabetes medicines or blood thinners. Taking medicines such as aspirin and ibuprofen. These medicines can thin your  blood. Do not take these medicines unless your health care provider tells you to take them. Taking over-the-counter medicines, vitamins, herbs, and supplements. Surgery safety Ask your health care provider: How your surgery site will be marked. What steps will be taken to help prevent infection. These steps may include: Removing hair at the surgery site. Washing skin with a germ-killing soap. Taking antibiotic medicine. General instructions Do not use any products that contain nicotine or tobacco for at least 4 weeks before the procedure. These  products include cigarettes, chewing tobacco, and vaping devices, such as e-cigarettes. If you need help quitting, ask your health care provider. Plan to have someone take you home from the hospital. If you will be going home right after the procedure, plan to have a responsible adult care for you for the time you are told. This is important. What happens during the procedure?     An IV will be inserted into one of your veins. You will be given one or more of the following: A medicine to help you relax (sedative). A medicine to numb the area (local anesthetic). A medicine to make you fall asleep (general anesthetic). A medicine that is injected into an area of your body to numb everything below the injection site (regional anesthetic). Your bladder may be emptied with a small tube (catheter). If you have been given a general anesthetic, a tube will be put down your throat to help you breathe. Two small incisions will be made in your lower abdomen and near your belly button. Your abdomen will be inflated with a gas. This will let the surgeon see better and will give the surgeon room to work. A lighted tube with camera (laparoscope) will be inserted into your abdomen through one of the incisions. Small instruments will be inserted through the other incision. The fallopian tubes will be tied off, burned (cauterized), or blocked with a clip, ring, or clamp. A small portion in the center of each fallopian tube may be removed. The gas will be released from the abdomen. The incisions will be closed with stitches (sutures). A bandage (dressing) will be placed over the incisions. The procedure may vary among health care providers and hospitals. What happens after the procedure? Your blood pressure, heart rate, breathing rate, and blood oxygen level will be monitored until you leave the hospital. You will be given medicine to help with pain, nausea, and vomiting as needed. You may have vaginal  discharge after the procedure. You may need to wear a sanitary napkin. If you were given a sedative during the procedure, it can affect you for several hours. Do not drive or operate machinery until your health care provider says that it is safe. Summary Laparoscopic tubal ligation is a procedure that is done to prevent pregnancy. You should not have this procedure if you want to get pregnant someday or if you are unsure about having more children. The procedure is done using a thin, lighted tube (laparoscope) with a camera attached that will be inserted into your abdomen through an incision. After the procedure you will be given medicine to help with pain, nausea, and vomiting as needed. Plan to have someone take you home from the hospital. This information is not intended to replace advice given to you by your health care provider. Make sure you discuss any questions you have with your health care provider. Document Revised: 07/04/2020 Document Reviewed: 07/04/2020 Elsevier Patient Education  2023 [employer]  

## 2022-08-12 ENCOUNTER — Encounter: Payer: Self-pay | Admitting: Obstetrics and Gynecology

## 2022-08-17 ENCOUNTER — Encounter: Payer: Self-pay | Admitting: Obstetrics and Gynecology

## 2022-08-17 ENCOUNTER — Other Ambulatory Visit: Payer: Self-pay

## 2022-08-17 DIAGNOSIS — Z30013 Encounter for initial prescription of injectable contraceptive: Secondary | ICD-10-CM

## 2022-08-17 DIAGNOSIS — B370 Candidal stomatitis: Secondary | ICD-10-CM

## 2022-08-17 MED ORDER — NYSTATIN-TRIAMCINOLONE 100000-0.1 UNIT/GM-% EX OINT: 1.0000 | TOPICAL_OINTMENT | Freq: Two times a day (BID) | CUTANEOUS | 0 refills | Status: DC

## 2022-08-17 MED ORDER — MEDROXYPROGESTERONE ACETATE 150 MG/ML IM SUSP: 150.0000 mg | INTRAMUSCULAR | 3 refills | Status: DC

## 2022-09-01 ENCOUNTER — Ambulatory Visit: Payer: 59 | Admitting: Obstetrics and Gynecology

## 2022-09-29 ENCOUNTER — Ambulatory Visit: Payer: 59 | Admitting: Obstetrics and Gynecology

## 2022-10-01 ENCOUNTER — Other Ambulatory Visit (HOSPITAL_COMMUNITY): Payer: 59

## 2022-10-12 ENCOUNTER — Ambulatory Visit: Admit: 2022-10-12 | Payer: 59 | Admitting: Obstetrics and Gynecology

## 2022-10-12 SURGERY — SALPINGECTOMY, BILATERAL, LAPAROSCOPIC
Anesthesia: Choice | Laterality: Bilateral

## 2023-04-20 ENCOUNTER — Ambulatory Visit: Payer: Commercial Managed Care - PPO | Admitting: Family Medicine

## 2023-09-15 ENCOUNTER — Ambulatory Visit: Payer: Medicaid Other | Admitting: Obstetrics and Gynecology

## 2023-09-15 ENCOUNTER — Encounter: Payer: Self-pay | Admitting: Obstetrics and Gynecology

## 2023-09-15 VITALS — BP 133/85 | HR 80 | Ht 61.0 in | Wt 213.4 lb

## 2023-09-15 DIAGNOSIS — Z3009 Encounter for other general counseling and advice on contraception: Secondary | ICD-10-CM | POA: Diagnosis not present

## 2023-09-15 NOTE — Progress Notes (Signed)
  CC: tubal consultation Subjective:    Patient ID: Theresa Bernard, female    DOB: January 22, 1986, 37 y.o.   MRN: [id number]  HPI 37 yo G7P6 seen for tubal ligation consultation.  Pt currently has a nexplanon place 11/23 by [facility]  She is concerned it is causing weight gain.Pt advised weight gain is usually minimal with nexplanon.  Discussed other birth control methods including pills, patch, IUD.  Pt desires permanent sterilization.  Patient desires permanent sterilization.  Other reversible forms of contraception (over the counter/barrier methods; hormonal contraceptives including pill, patch, ring, Depo-Provera injection, Nexplanon implant; hormonal IUDs Skyla and Mirena; nonhormonal copper IUD Paragard) were discussed with patient; she declined all these modalities. Also discussed the option of vasectomy for her female partner; she also declined this option. She was given the choice between laparoscopic bilateral tubal sterilization using Filshie clips or laparoscopic bilateral salpingectomy. For the Filshie clip sterilization, she was told she will have one incision in her umbilicus.  Failure risk of 1-2 % with increased risk of ectopic gestation if pregnancy occurs was also discussed with patient. For the bilateral salpingectomy, she was told that both tubes will be resected via three small incisions; the failure risk of less than 1%.  Any future pregnancies will have to be attempted via IVF or other fertility procedures.  Reiterated permanence and irreversibility of both procedures; in the case of Filshie clip application, attempts to reverse tubal sterilization are often not successful.  Also emphasized risk of regret which is noted more in patients less than the age of 23.  All questions were answered. She desires laparoscopic bilateral salpingectomy.  Other risks of the procedure were discussed with patient including but not limited to: bleeding, infection, injury to surrounding organs  and need for additional procedures.  Also discussed possibility of post-tubal pain syndrome. Patient verbalized understanding of these risks and wants to proceed with this procedure.  She was told that she will be contacted by our surgical scheduler regarding the time and date of her surgery; routine preoperative instructions of having nothing to eat or drink after midnight on the day prior to surgery and also coming to the hospital 1.5 hours prior to her time of surgery were also emphasized.  She was told she may be called for a preoperative appointment about a week prior to surgery and will be given further preoperative instructions at that visit. Printed patient education handouts about the procedure were given to the patient to review at home. Medicaid papers were signed today.     Review of Systems     Objective:   Physical Exam Vitals:   09/15/23 1424  BP: 133/85  Pulse: 80         Assessment & Plan:   1. Unwanted fertility Pt desires permanent sterilization.   Medicaid forms signed. Referral sent to scheduler Pap is up to date - Ambulatory Referral For Surgery Scheduling  I spent 20 minutes dedicated to the care of this patient including previsit review of records, face to face time with the patient discussing previous deliveries, contraception counseling/options and post visit testing.   Warden Fillers, MD Faculty Attending, [facility]

## 2023-09-15 NOTE — Progress Notes (Signed)
Pt is in the office for BTL consult Currently on nexplanon

## 2023-09-27 ENCOUNTER — Encounter: Payer: Commercial Managed Care - PPO | Admitting: Nurse Practitioner

## 2023-09-27 ENCOUNTER — Ambulatory Visit: Payer: Commercial Managed Care - PPO | Admitting: Obstetrics and Gynecology

## 2023-10-17 ENCOUNTER — Telehealth: Payer: Self-pay

## 2023-10-17 NOTE — Telephone Encounter (Signed)
Called patient to schedule surgery w/ Dr. Donavan Foil. Left voicemail asking for a return call at [phone].

## 2023-11-08 ENCOUNTER — Telehealth: Payer: Self-pay

## 2023-11-08 NOTE — Telephone Encounter (Signed)
 Called patient to see if she was available for surgery w/ Dr. Donavan Foil on 12/27/23 @2  pm? Left voicemail asking patient to call me at [phone].

## 2023-11-24 ENCOUNTER — Encounter: Payer: Self-pay | Admitting: Obstetrics and Gynecology

## 2023-11-28 ENCOUNTER — Telehealth: Payer: Self-pay

## 2023-11-28 NOTE — Telephone Encounter (Signed)
Patient called to schedule surgery w/ Dr. Donavan Foil. I advised that I called her in December & January and left a voicemail each time asking her to call me to schedule. Patient states the number listed as "Home" is invalid now and she thought she updated her contacts w/ the office. Advised Dr. Donavan Foil is booked through March and I will call her if he has a cancellation or once the [facility] schedule is finalized. Patient confirmed understanding.

## 2023-12-14 ENCOUNTER — Ambulatory Visit (INDEPENDENT_AMBULATORY_CARE_PROVIDER_SITE_OTHER): Payer: Medicaid Other | Admitting: Podiatry

## 2023-12-14 DIAGNOSIS — M7662 Achilles tendinitis, left leg: Secondary | ICD-10-CM

## 2023-12-14 NOTE — Progress Notes (Signed)
  Subjective:  Patient ID: Theresa Bernard, female    DOB: May 27, 1986,  MRN: [id number]  Chief Complaint  Patient presents with   Injections    38 y.o. female presents with the above complaint.  Patient presents with left Achilles tendinitis insertional pain pain with dorsiflexion of the ankle joint no pain with plantarflexion of the ankle joint palpable Haglund deformity noted.  Pain scale 7 out of 10 dull aching nature hurts with ambulation   Review of Systems: Negative except as noted in the HPI. Denies N/V/F/Ch.  Past Medical History:  Diagnosis Date   Anxiety    Depression     Current Outpatient Medications:    etonogestrel (NEXPLANON) 68 MG IMPL implant, 1 each by Subdermal route once., Disp: , Rfl:    ferrous sulfate (FERROUSUL) 325 (65 FE) MG tablet, Take 1 tablet (325 mg total) by mouth every other day. (Patient not taking: Reported on 08/11/2022), Disp: 60 tablet, Rfl: 0   ibuprofen (ADVIL) 600 MG tablet, Take 1 tablet (600 mg total) by mouth every 6 (six) hours., Disp: 30 tablet, Rfl: 0   medroxyPROGESTERone (DEPO-PROVERA) 150 MG/ML injection, Inject 1 mL (150 mg total) into the muscle every 3 (three) months., Disp: 1 mL, Rfl: 3   nystatin-triamcinolone ointment (MYCOLOG), Apply 1 Application topically 2 (two) times daily., Disp: 30 g, Rfl: 0  Social History   Tobacco Use  Smoking Status Never  Smokeless Tobacco Never    No Known Allergies Objective:  There were no vitals filed for this visit. There is no height or weight on file to calculate BMI. Constitutional Well developed. Well nourished.  Vascular Dorsalis pedis pulses palpable bilaterally. Posterior tibial pulses palpable bilaterally. Capillary refill normal to all digits.  No cyanosis or clubbing noted. Pedal hair growth normal.  Neurologic Normal speech. Oriented to person, place, and time. Epicritic sensation to light touch grossly present bilaterally.  Dermatologic Nails well groomed and normal in  appearance. No open wounds. No skin lesions.  Orthopedic: Pain on palpation to the left Achilles tendon insertion positive Silfverskiold test noted with gastrocnemius equinus.  High positive deformity noted no pain at the posterior tibial tendon peroneal tendon ATFL ligament   Radiographs: None Assessment:   1. Achilles tendinitis, left leg    Plan:  Patient was evaluated and treated and all questions answered.  Left Achilles tendinitis -All questions and concerns were discussed with the patient in extensive detail given the amount of pain that she is experiencing she will benefit from and boot immobilization Cam boot was dispensed.  I encouraged her to wear it for next 4 weeks. -Will discuss with orthotics option sugar modification or steroid injection under next visit  No follow-ups on file.

## 2024-01-04 ENCOUNTER — Telehealth: Payer: Self-pay

## 2024-01-04 NOTE — Telephone Encounter (Signed)
 Patient called to schedule surgery w/ Dr. Donavan Foil. Advised Dr. Donavan Foil is booked through March and is not scheduled for the OR in May and June. Patient would like to request another surgeon. Advised I would reach out to Dr. Briscoe Deutscher to see if she's willing to complete surgery on 02/06/24. I will call patient back once I have an answer.

## 2024-01-05 ENCOUNTER — Ambulatory Visit: Admitting: Obstetrics and Gynecology

## 2024-01-18 ENCOUNTER — Ambulatory Visit: Payer: Medicaid Other | Admitting: Podiatry

## 2024-01-31 NOTE — H&P (View-Only) (Signed)
 OB/GYN Pre-Op History and Physical  Theresa Bernard is a 38 y.o. [id number] presenting for preop.   Discussed the use of AI scribe software for clinical note transcription with the patient, who gave verbal consent to proceed.  History of Present Illness Theresa Bernard is a 38 year old female who presents for a laparoscopic bilateral salpingectomy. She is accompanied by her husband.  She is scheduled for a laparoscopic bilateral salpingectomy, which involves the removal of the fallopian tubes.   She experiences anxiety about the procedure, noting that she has not undergone major surgery as an adult, aside from wisdom tooth extraction and a tonsillectomy at age [age].  She is considering the removal of her Nexplanon implant post-surgery, as she believes it has contributed to weight gain. She does not currently have a menstrual cycle due to the implant but is prepared for the possibility of resuming her periods once it is removed.  She plans to take two days off work for recovery, although she works from home. She anticipates some pain post-procedure but expects to resume normal activities within a few days. She has a one-year-old child and plans to have her grandmother assist with childcare to avoid lifting, which may cause discomfort.        Past Medical History:  Diagnosis Date   Anxiety    Depression     Past Surgical History:  Procedure Laterality Date   TONSILLECTOMY  64   WISDOM TOOTH EXTRACTION      OB History  Gravida Para Term Preterm AB Living  7 6 6  1 6   SAB IAB Ectopic Multiple Live Births   1  0 6    # Outcome Date GA Lbr Len/2nd Weight Sex Type Anes PTL Lv  7 Term 07/18/22 [redacted]w[redacted]d 08:03 / 00:06 8 lb 9.9 oz (3.91 kg) F Vag-Spont EPI  LIV  6 Term 03/15/19 [redacted]w[redacted]d   M Vag-Spont   LIV  5 Term 11/28/16 [redacted]w[redacted]d   M Vag-Spont   LIV  4 Term 01/18/12 [redacted]w[redacted]d   M Vag-Spont   LIV  3 IAB 2012          2 Term 10/10/06 [redacted]w[redacted]d   M Vag-Spont   LIV  1 Term 08/28/05 [redacted]w[redacted]d   M  Vag-Spont   LIV    Obstetric Comments  Never had GDM with pre-2023 pregnancies  2018: 9lbs 13oz  Other babies were b/w 8 to 9 1/2 lbs.   No issues with any deliveries    Social History   Socioeconomic History   Marital status: Married    Spouse name: Not on file   Number of children: Not on file   Years of education: Not on file   Highest education level: Not on file  Occupational History   Not on file  Tobacco Use   Smoking status: Never   Smokeless tobacco: Never  Vaping Use   Vaping status: Never Used  Substance and Sexual Activity   Alcohol use: Not Currently    Comment: 1 x per week prior to pregnancy   Drug use: Never   Sexual activity: Yes    Partners: Male    Birth control/protection: None  Other Topics Concern   Not on file  Social History Narrative   Not on file   Social Drivers of Health   Financial Resource Strain: Not on file  Food Insecurity: No Food Insecurity (07/18/2022)   Hunger Vital Sign    Worried About Running Out of Food in the Last Year:  Never true    Ran Out of Food in the Last Year: Never true  Transportation Needs: No Transportation Needs (07/18/2022)   PRAPARE - Administrator, Civil Service (Medical): No    Lack of Transportation (Non-Medical): No  Physical Activity: Not on file  Stress: Not on file  Social Connections: Not on file    Family History  Problem Relation Age of Onset   Heart murmur Mother    COPD Maternal Grandmother    Stomach cancer Maternal Grandfather     (Not in a hospital admission)   No Known Allergies  Review of Systems: Negative except for what is mentioned in HPI.     Physical Exam: BP 113/77   Pulse 67   Ht 5\' 1"  (1.549 m)   Wt 209 lb (94.8 kg)   LMP 10/22/2021   BMI 39.49 kg/m  CONSTITUTIONAL: Well-developed, well-nourished and in no acute distress.  HENT:  Normocephalic, atraumatic, External right and left ear normal. Oropharynx is clear and moist EYES: Conjunctivae and EOM  are normal. Pupils are equal, round, and reactive to light. No scleral icterus.  NECK: Normal range of motion, supple, no masses SKIN: Skin is warm and dry. No rash noted. Not diaphoretic. No erythema. No pallor. NEUROLGIC: Alert and oriented to person, place, and time. Normal reflexes, muscle tone coordination. No cranial nerve deficit noted. PSYCHIATRIC: Normal mood and affect. Normal behavior. Normal judgment and thought content. RESPIRATORY: Normal effort PELVIC: Deferred   Pertinent Labs/Studies:   No results found for this or any previous visit (from the past 72 hours).     Assessment and Plan :Theresa Bernard is a 38 y.o. [id number] here for preop visit.  Assessment and Plan Assessment & Plan Bilateral Salpingectomy Scheduled for laparoscopic bilateral salpingectomy for permanent contraception. Prefers this method. Informed about procedure, risks, and recovery. Informed consent obtained. - Tubal consent signed on 09/15/23 - Advise to avoid driving for 24 hours post-procedure. - Instruct to avoid heavy lifting and manage pain as needed.  Nexplanon Removal Requested removal due to side effects. Coordinated with salpingectomy. - Remove Nexplanon implant during the salpingectomy. - Add Nexplanon removal to the consent form.  Preoperative Instructions Advised on preoperative preparations and potential return of menstrual cycle post-Nexplanon removal. - Avoid deodorant and nail polish on the day of surgery. - Prepare for potential return of menstrual cycle post-Nexplanon removal.  Postoperative Care Informed about recovery timeline and discharge. Advised to plan for assistance post-surgery. - Schedule follow-up appointment as needed post-procedure. - Arrange for childcare and transportation assistance post-surgery.   Patient desires surgical management with [facility].  The risks of surgery were discussed in detail with the patient including but not limited to: bleeding which may  require transfusion or reoperation; infection which may require prolonged hospitalization or re-hospitalization and antibiotic therapy; injury to bowel, bladder, ureters and major vessels or other surrounding organs which may lead to other procedures; formation of adhesions; need for additional procedures including laparotomy or subsequent procedures secondary to intraoperative injury or abnormal pathology; thromboembolic phenomenon; incisional problems and other postoperative or anesthesia complications.  Patient was told that the likelihood that her condition and symptoms will be treated effectively with this surgical management was high; the postoperative expectations were also discussed in detail. The patient also understands the alternative treatment options which were discussed in full. All questions were answered.  She was told that she will be contacted by our surgical scheduler regarding the time and date of her surgery; routine  preoperative instructions will be given to her by the preoperative nursing team.    Lorriane Shire, M.D. Minimally Invasive Gynecologic Surgery and Pelvic Pain Specialist Attending Obstetrician & Gynecologist, Faculty Practice Center for [employer], [facility]

## 2024-01-31 NOTE — Progress Notes (Unsigned)
 OB/GYN Pre-Op History and Physical  Theresa Bernard is a 38 y.o. [id number] presenting for preop.   Discussed the use of AI scribe software for clinical note transcription with the patient, who gave verbal consent to proceed.  History of Present Illness Theresa Bernard is a 38 year old female who presents for a laparoscopic bilateral salpingectomy. She is accompanied by her husband.  She is scheduled for a laparoscopic bilateral salpingectomy, which involves the removal of the fallopian tubes.   She experiences anxiety about the procedure, noting that she has not undergone major surgery as an adult, aside from wisdom tooth extraction and a tonsillectomy at age [age].  She is considering the removal of her Nexplanon implant post-surgery, as she believes it has contributed to weight gain. She does not currently have a menstrual cycle due to the implant but is prepared for the possibility of resuming her periods once it is removed.  She plans to take two days off work for recovery, although she works from home. She anticipates some pain post-procedure but expects to resume normal activities within a few days. She has a one-year-old child and plans to have her grandmother assist with childcare to avoid lifting, which may cause discomfort.        Past Medical History:  Diagnosis Date   Anxiety    Depression     Past Surgical History:  Procedure Laterality Date   TONSILLECTOMY  82   WISDOM TOOTH EXTRACTION      OB History  Gravida Para Term Preterm AB Living  7 6 6  1 6   SAB IAB Ectopic Multiple Live Births   1  0 6    # Outcome Date GA Lbr Len/2nd Weight Sex Type Anes PTL Lv  7 Term 07/18/22 [redacted]w[redacted]d 08:03 / 00:06 8 lb 9.9 oz (3.91 kg) F Vag-Spont EPI  LIV  6 Term 03/15/19 [redacted]w[redacted]d   M Vag-Spont   LIV  5 Term 11/28/16 [redacted]w[redacted]d   M Vag-Spont   LIV  4 Term 01/18/12 [redacted]w[redacted]d   M Vag-Spont   LIV  3 IAB 2012          2 Term 10/10/06 [redacted]w[redacted]d   M Vag-Spont   LIV  1 Term 08/28/05 [redacted]w[redacted]d   M  Vag-Spont   LIV    Obstetric Comments  Never had GDM with pre-2023 pregnancies  2018: 9lbs 13oz  Other babies were b/w 8 to 9 1/2 lbs.   No issues with any deliveries    Social History   Socioeconomic History   Marital status: Married    Spouse name: Not on file   Number of children: Not on file   Years of education: Not on file   Highest education level: Not on file  Occupational History   Not on file  Tobacco Use   Smoking status: Never   Smokeless tobacco: Never  Vaping Use   Vaping status: Never Used  Substance and Sexual Activity   Alcohol use: Not Currently    Comment: 1 x per week prior to pregnancy   Drug use: Never   Sexual activity: Yes    Partners: Male    Birth control/protection: None  Other Topics Concern   Not on file  Social History Narrative   Not on file   Social Drivers of Health   Financial Resource Strain: Not on file  Food Insecurity: No Food Insecurity (07/18/2022)   Hunger Vital Sign    Worried About Running Out of Food in the Last Year:  Never true    Ran Out of Food in the Last Year: Never true  Transportation Needs: No Transportation Needs (07/18/2022)   PRAPARE - Administrator, Civil Service (Medical): No    Lack of Transportation (Non-Medical): No  Physical Activity: Not on file  Stress: Not on file  Social Connections: Not on file    Family History  Problem Relation Age of Onset   Heart murmur Mother    COPD Maternal Grandmother    Stomach cancer Maternal Grandfather     (Not in a hospital admission)   No Known Allergies  Review of Systems: Negative except for what is mentioned in HPI.     Physical Exam: BP 113/77   Pulse 67   Ht 5\' 1"  (1.549 m)   Wt 209 lb (94.8 kg)   LMP 10/22/2021   BMI 39.49 kg/m  CONSTITUTIONAL: Well-developed, well-nourished and in no acute distress.  HENT:  Normocephalic, atraumatic, External right and left ear normal. Oropharynx is clear and moist EYES: Conjunctivae and EOM  are normal. Pupils are equal, round, and reactive to light. No scleral icterus.  NECK: Normal range of motion, supple, no masses SKIN: Skin is warm and dry. No rash noted. Not diaphoretic. No erythema. No pallor. NEUROLGIC: Alert and oriented to person, place, and time. Normal reflexes, muscle tone coordination. No cranial nerve deficit noted. PSYCHIATRIC: Normal mood and affect. Normal behavior. Normal judgment and thought content. RESPIRATORY: Normal effort PELVIC: Deferred   Pertinent Labs/Studies:   No results found for this or any previous visit (from the past 72 hours).     Assessment and Plan :Theresa Bernard is a 38 y.o. [id number] here for preop visit.  Assessment and Plan Assessment & Plan Bilateral Salpingectomy Scheduled for laparoscopic bilateral salpingectomy for permanent contraception. Prefers this method. Informed about procedure, risks, and recovery. Informed consent obtained. - Tubal consent signed on 09/15/23 - Advise to avoid driving for 24 hours post-procedure. - Instruct to avoid heavy lifting and manage pain as needed.  Nexplanon Removal Requested removal due to side effects. Coordinated with salpingectomy. - Remove Nexplanon implant during the salpingectomy. - Add Nexplanon removal to the consent form.  Preoperative Instructions Advised on preoperative preparations and potential return of menstrual cycle post-Nexplanon removal. - Avoid deodorant and nail polish on the day of surgery. - Prepare for potential return of menstrual cycle post-Nexplanon removal.  Postoperative Care Informed about recovery timeline and discharge. Advised to plan for assistance post-surgery. - Schedule follow-up appointment as needed post-procedure. - Arrange for childcare and transportation assistance post-surgery.   Patient desires surgical management with [facility].  The risks of surgery were discussed in detail with the patient including but not limited to: bleeding which may  require transfusion or reoperation; infection which may require prolonged hospitalization or re-hospitalization and antibiotic therapy; injury to bowel, bladder, ureters and major vessels or other surrounding organs which may lead to other procedures; formation of adhesions; need for additional procedures including laparotomy or subsequent procedures secondary to intraoperative injury or abnormal pathology; thromboembolic phenomenon; incisional problems and other postoperative or anesthesia complications.  Patient was told that the likelihood that her condition and symptoms will be treated effectively with this surgical management was high; the postoperative expectations were also discussed in detail. The patient also understands the alternative treatment options which were discussed in full. All questions were answered.  She was told that she will be contacted by our surgical scheduler regarding the time and date of her surgery; routine  preoperative instructions will be given to her by the preoperative nursing team.    Lorriane Shire, M.D. Minimally Invasive Gynecologic Surgery and Pelvic Pain Specialist Attending Obstetrician & Gynecologist, Faculty Practice Center for [employer], [facility]

## 2024-02-01 ENCOUNTER — Encounter: Payer: Self-pay | Admitting: Obstetrics and Gynecology

## 2024-02-01 ENCOUNTER — Ambulatory Visit: Admitting: Obstetrics and Gynecology

## 2024-02-01 ENCOUNTER — Other Ambulatory Visit: Payer: Self-pay

## 2024-02-01 VITALS — BP 113/77 | HR 67 | Ht 61.0 in | Wt 209.0 lb

## 2024-02-01 DIAGNOSIS — Z3009 Encounter for other general counseling and advice on contraception: Secondary | ICD-10-CM | POA: Diagnosis not present

## 2024-02-01 DIAGNOSIS — Z3046 Encounter for surveillance of implantable subdermal contraceptive: Secondary | ICD-10-CM | POA: Diagnosis not present

## 2024-02-01 DIAGNOSIS — Z975 Presence of (intrauterine) contraceptive device: Secondary | ICD-10-CM

## 2024-02-01 NOTE — Progress Notes (Signed)
 No c/o today. Plans pre-op for BTL.

## 2024-02-02 ENCOUNTER — Other Ambulatory Visit: Payer: Self-pay

## 2024-02-02 ENCOUNTER — Encounter (HOSPITAL_COMMUNITY): Payer: Self-pay | Admitting: Obstetrics and Gynecology

## 2024-02-02 NOTE — Progress Notes (Addendum)
 PCP - [facility]  Cardiologist -   PPM/ICD - denies Device Orders - n/a Rep Notified - n/a  Chest x-ray - denies EKG - denies Stress Test - denies ECHO - denies Cardiac Cath - denies  CPAP - denies  DM denies  Blood Thinner Instructions: denies Aspirin Instructions: n/a  ERAS Protcol - NPO  COVID TEST- n/a  Anesthesia review: no  Patient verbally denies any shortness of breath, fever, cough and chest pain during phone call   -------------  SDW INSTRUCTIONS given:  Your procedure is scheduled on February 06, 2024.  Report to [facility] Main Entrance "A" at 6:15 A.M., and check in at the Admitting office.  Call this number if you have problems the morning of surgery:  [phone]   Remember:  Do not eat  or drink after midnight the night before your surgery      Take these medicines the morning of surgery with A SIP OF WATER NONE  As of today, STOP taking any Aspirin (unless otherwise instructed by your surgeon) Aleve, Naproxen, Ibuprofen, Motrin, Advil, Goody's, BC's, all herbal medications, fish oil, and all vitamins.                      Do not wear jewelry, make up, or nail polish            Do not wear lotions, powders, perfumes/colognes, or deodorant.            Do not shave 48 hours prior to surgery.  Men may shave face and neck.            Do not bring valuables to the hospital.            [facility] is not responsible for any belongings or valuables.  Do NOT Smoke (Tobacco/Vaping) 24 hours prior to your procedure If you use a CPAP at night, you may bring all equipment for your overnight stay.   Contacts, glasses, dentures or bridgework may not be worn into surgery.      For patients admitted to the hospital, discharge time will be determined by your treatment team.   Patients discharged the day of surgery will not be allowed to drive home, and someone needs to stay with them for 24 hours.    Special instructions:   [facility]- Preparing  For Surgery  Before surgery, you can play an important role. Because skin is not sterile, your skin needs to be as free of germs as possible. You can reduce the number of germs on your skin by washing with CHG (chlorahexidine gluconate) Soap before surgery.  CHG is an antiseptic cleaner which kills germs and bonds with the skin to continue killing germs even after washing.    Oral Hygiene is also important to reduce your risk of infection.  Remember - BRUSH YOUR TEETH THE MORNING OF SURGERY WITH YOUR REGULAR TOOTHPASTE  Please do not use if you have an allergy to CHG or antibacterial soaps. If your skin becomes reddened/irritated stop using the CHG.  Do not shave (including legs and underarms) for at least 48 hours prior to first CHG shower. It is OK to shave your face.  Please follow these instructions carefully.   Shower the NIGHT BEFORE SURGERY and the MORNING OF SURGERY with DIAL Soap.   Pat yourself dry with a CLEAN TOWEL.  Wear CLEAN PAJAMAS to bed the night before surgery  Place CLEAN SHEETS on your bed the night of  your first shower and DO NOT SLEEP WITH PETS.   Day of Surgery: Please shower morning of surgery  Wear Clean/Comfortable clothing the morning of surgery Do not apply any deodorants/lotions.   Remember to brush your teeth WITH YOUR REGULAR TOOTHPASTE.   Questions were answered. Patient verbalized understanding of instructions.

## 2024-02-05 NOTE — Anesthesia Preprocedure Evaluation (Signed)
 Anesthesia Evaluation  Patient identified by MRN, date of birth, ID band Patient awake    Reviewed: Allergy & Precautions, NPO status , Patient's Chart, lab work & pertinent test results  Airway Mallampati: I  TM Distance: >3 FB Neck ROM: Full    Dental no notable dental hx. (+) Teeth Intact, Dental Advisory Given   Pulmonary neg pulmonary ROS, Patient abstained from smoking.   Pulmonary exam normal breath sounds clear to auscultation       Cardiovascular negative cardio ROS Normal cardiovascular exam Rhythm:Regular Rate:Normal     Neuro/Psych  PSYCHIATRIC DISORDERS Anxiety Depression    negative neurological ROS     GI/Hepatic negative GI ROS, Neg liver ROS,,,  Endo/Other    Class 3 obesity (BMI 40)  Renal/GU negative Renal ROS  negative genitourinary   Musculoskeletal negative musculoskeletal ROS (+)    Abdominal   Peds  Hematology  (+) Blood dyscrasia, anemia Lab Results      Component                Value               Date                      WBC                      10.6 (H)            07/18/2022                HGB                      9.5 (L)             07/18/2022                HCT                      28.6 (L)            07/18/2022                MCV                      83.1                07/18/2022                PLT                      196                 07/18/2022              Anesthesia Other Findings   Reproductive/Obstetrics                             Anesthesia Physical Anesthesia Plan  ASA: 3  Anesthesia Plan: General   Post-op Pain Management: Tylenol PO (pre-op)*   Induction: Intravenous  PONV Risk Score and Plan: 3 and Midazolam, Dexamethasone and Ondansetron  Airway Management Planned: Oral ETT  Additional Equipment:   Intra-op Plan:   Post-operative Plan: Extubation in OR  Informed Consent: I have reviewed the patients History and Physical,  chart, labs and discussed the procedure including the risks, benefits and alternatives for the proposed anesthesia with the patient  or authorized representative who has indicated his/her understanding and acceptance.     Dental advisory given  Plan Discussed with: CRNA  Anesthesia Plan Comments:        Anesthesia Quick Evaluation

## 2024-02-06 ENCOUNTER — Other Ambulatory Visit: Payer: Self-pay

## 2024-02-06 ENCOUNTER — Ambulatory Visit (HOSPITAL_COMMUNITY): Payer: Self-pay | Admitting: Anesthesiology

## 2024-02-06 ENCOUNTER — Encounter (HOSPITAL_COMMUNITY)
Admission: RE | Disposition: A | Discharge status: Home / Self Care | Payer: Self-pay | Source: Home / Self Care | Attending: Obstetrics and Gynecology

## 2024-02-06 ENCOUNTER — Ambulatory Visit (HOSPITAL_COMMUNITY)
Admission: RE | Admit: 2024-02-06 | Discharge: 2024-02-06 | Disposition: A | Discharge status: Home / Self Care | Attending: Obstetrics and Gynecology | Admitting: Obstetrics and Gynecology

## 2024-02-06 ENCOUNTER — Ambulatory Visit (HOSPITAL_BASED_OUTPATIENT_CLINIC_OR_DEPARTMENT_OTHER): Payer: Self-pay | Admitting: Anesthesiology

## 2024-02-06 ENCOUNTER — Encounter (HOSPITAL_COMMUNITY): Payer: Self-pay | Admitting: Obstetrics and Gynecology

## 2024-02-06 ENCOUNTER — Other Ambulatory Visit (HOSPITAL_COMMUNITY): Payer: Self-pay

## 2024-02-06 DIAGNOSIS — Z6839 Body mass index (BMI) 39.0-39.9, adult: Secondary | ICD-10-CM | POA: Diagnosis not present

## 2024-02-06 DIAGNOSIS — Z302 Encounter for sterilization: Secondary | ICD-10-CM

## 2024-02-06 DIAGNOSIS — F32A Depression, unspecified: Secondary | ICD-10-CM | POA: Diagnosis not present

## 2024-02-06 DIAGNOSIS — Z3046 Encounter for surveillance of implantable subdermal contraceptive: Secondary | ICD-10-CM

## 2024-02-06 DIAGNOSIS — E66813 Obesity, class 3: Secondary | ICD-10-CM | POA: Diagnosis not present

## 2024-02-06 DIAGNOSIS — F419 Anxiety disorder, unspecified: Secondary | ICD-10-CM | POA: Insufficient documentation

## 2024-02-06 DIAGNOSIS — Z3009 Encounter for other general counseling and advice on contraception: Secondary | ICD-10-CM

## 2024-02-06 DIAGNOSIS — D649 Anemia, unspecified: Secondary | ICD-10-CM | POA: Insufficient documentation

## 2024-02-06 HISTORY — PX: IUD REMOVAL: SHX5392

## 2024-02-06 HISTORY — PX: LAPAROSCOPIC BILATERAL SALPINGECTOMY: SHX5889

## 2024-02-06 HISTORY — DX: Anemia, unspecified: D64.9

## 2024-02-06 LAB — CBC
HCT: 36.7 % (ref 36.0–46.0)
Hemoglobin: 11.9 g/dL — ABNORMAL LOW (ref 12.0–15.0)
MCH: 28.6 pg (ref 26.0–34.0)
MCHC: 32.4 g/dL (ref 30.0–36.0)
MCV: 88.2 fL (ref 80.0–100.0)
Platelets: 241 10*3/uL (ref 150–400)
RBC: 4.16 MIL/uL (ref 3.87–5.11)
RDW: 13.2 % (ref 11.5–15.5)
WBC: 7.7 10*3/uL (ref 4.0–10.5)
nRBC: 0 % (ref 0.0–0.2)

## 2024-02-06 LAB — POCT PREGNANCY, URINE: Preg Test, Ur: NEGATIVE

## 2024-02-06 SURGERY — SALPINGECTOMY, BILATERAL, LAPAROSCOPIC
Anesthesia: General | Site: Arm Upper

## 2024-02-06 MED ORDER — KETOROLAC TROMETHAMINE 30 MG/ML IJ SOLN
INTRAMUSCULAR | Status: DC | PRN
  Administered 2024-02-06: 30 mg via INTRAVENOUS

## 2024-02-06 MED ORDER — AMISULPRIDE (ANTIEMETIC) 5 MG/2ML IV SOLN: 10.0000 mg | Freq: Once | INTRAVENOUS | Status: DC | PRN

## 2024-02-06 MED ORDER — CEFAZOLIN SODIUM-DEXTROSE 2-4 GM/100ML-% IV SOLN
INTRAVENOUS | Status: AC
  Filled 2024-02-06: qty 100

## 2024-02-06 MED ORDER — MIDAZOLAM HCL 2 MG/2ML IJ SOLN
INTRAMUSCULAR | Status: DC | PRN
  Administered 2024-02-06: 2 mg via INTRAVENOUS

## 2024-02-06 MED ORDER — PROPOFOL 10 MG/ML IV BOLUS
INTRAVENOUS | Status: AC
  Filled 2024-02-06: qty 20

## 2024-02-06 MED ORDER — DEXAMETHASONE SODIUM PHOSPHATE 10 MG/ML IJ SOLN
INTRAMUSCULAR | Status: DC | PRN
Start: 2024-02-06 — End: 2024-02-06
  Administered 2024-02-06: 10 mg via INTRAVENOUS

## 2024-02-06 MED ORDER — FENTANYL CITRATE (PF) 250 MCG/5ML IJ SOLN
INTRAMUSCULAR | Status: DC | PRN
  Administered 2024-02-06: 100 ug via INTRAVENOUS
  Administered 2024-02-06: 50 ug via INTRAVENOUS

## 2024-02-06 MED ORDER — CHLORHEXIDINE GLUCONATE 0.12 % MT SOLN
15.0000 mL | OROMUCOSAL | Status: AC
  Administered 2024-02-06: 15 mL via OROMUCOSAL
  Filled 2024-02-06: qty 15

## 2024-02-06 MED ORDER — BUPIVACAINE HCL (PF) 0.5 % IJ SOLN
INTRAMUSCULAR | Status: DC | PRN
  Administered 2024-02-06: 12 mL

## 2024-02-06 MED ORDER — SUGAMMADEX SODIUM 200 MG/2ML IV SOLN
INTRAVENOUS | Status: DC | PRN
Start: 2024-02-06 — End: 2024-02-06
  Administered 2024-02-06: 400 mg via INTRAVENOUS

## 2024-02-06 MED ORDER — FENTANYL CITRATE (PF) 100 MCG/2ML IJ SOLN: 25.0000 ug | INTRAMUSCULAR | Status: DC | PRN

## 2024-02-06 MED ORDER — OXYCODONE HCL 5 MG/5ML PO SOLN: 5.0000 mg | Freq: Once | ORAL | Status: DC | PRN

## 2024-02-06 MED ORDER — IBUPROFEN 800 MG PO TABS
800.0000 mg | ORAL_TABLET | Freq: Three times a day (TID) | ORAL | 0 refills | Status: AC | PRN
  Filled 2024-02-06: qty 30, 10d supply, fill #0

## 2024-02-06 MED ORDER — OXYCODONE HCL 5 MG PO TABS: 5.0000 mg | ORAL_TABLET | Freq: Once | ORAL | Status: DC | PRN

## 2024-02-06 MED ORDER — SCOPOLAMINE 1 MG/3DAYS TD PT72
1.0000 | MEDICATED_PATCH | TRANSDERMAL | Status: DC
  Administered 2024-02-06: 1.5 mg via TRANSDERMAL
  Filled 2024-02-06: qty 1

## 2024-02-06 MED ORDER — ACETAMINOPHEN 500 MG PO TABS
1000.0000 mg | ORAL_TABLET | ORAL | Status: AC
  Administered 2024-02-06: 1000 mg via ORAL
  Filled 2024-02-06: qty 2

## 2024-02-06 MED ORDER — OXYCODONE HCL 5 MG PO TABS
5.0000 mg | ORAL_TABLET | ORAL | 0 refills | Status: AC | PRN
  Filled 2024-02-06: qty 8, 2d supply, fill #0

## 2024-02-06 MED ORDER — PROPOFOL 10 MG/ML IV BOLUS
INTRAVENOUS | Status: DC | PRN
  Administered 2024-02-06: 50 mg via INTRAVENOUS
  Administered 2024-02-06: 150 mg via INTRAVENOUS

## 2024-02-06 MED ORDER — MIDAZOLAM HCL 2 MG/2ML IJ SOLN
INTRAMUSCULAR | Status: AC
  Filled 2024-02-06: qty 2

## 2024-02-06 MED ORDER — LIDOCAINE 2% (20 MG/ML) 5 ML SYRINGE
INTRAMUSCULAR | Status: DC | PRN
  Administered 2024-02-06: 80 mg via INTRAVENOUS

## 2024-02-06 MED ORDER — ROCURONIUM BROMIDE 10 MG/ML (PF) SYRINGE
PREFILLED_SYRINGE | INTRAVENOUS | Status: DC | PRN
  Administered 2024-02-06: 30 mg via INTRAVENOUS
  Administered 2024-02-06: 50 mg via INTRAVENOUS

## 2024-02-06 MED ORDER — LACTATED RINGERS IV SOLN: INTRAVENOUS | Status: DC

## 2024-02-06 MED ORDER — BUPIVACAINE HCL (PF) 0.5 % IJ SOLN
INTRAMUSCULAR | Status: AC
  Filled 2024-02-06: qty 30

## 2024-02-06 MED ORDER — ACETAMINOPHEN 500 MG PO TABS: 1000.0000 mg | ORAL_TABLET | Freq: Once | ORAL | Status: DC

## 2024-02-06 MED ORDER — FENTANYL CITRATE (PF) 250 MCG/5ML IJ SOLN
INTRAMUSCULAR | Status: AC
  Filled 2024-02-06: qty 5

## 2024-02-06 MED ORDER — ONDANSETRON HCL 4 MG/2ML IJ SOLN
INTRAMUSCULAR | Status: DC | PRN
  Administered 2024-02-06: 4 mg via INTRAVENOUS

## 2024-02-06 MED ORDER — CEFAZOLIN SODIUM-DEXTROSE 2-3 GM-%(50ML) IV SOLR
INTRAVENOUS | Status: DC | PRN
  Administered 2024-02-06: 2 g via INTRAVENOUS

## 2024-02-06 SURGICAL SUPPLY — 42 items
APPLICATOR ARISTA FLEXITIP XL (MISCELLANEOUS) IMPLANT
BNDG COHESIVE 1X5 TAN STRL LF (GAUZE/BANDAGES/DRESSINGS) IMPLANT
CHLORAPREP W/TINT 26 (MISCELLANEOUS) ×2 IMPLANT
CLSR STERI-STRIP ANTIMIC 1/2X4 (GAUZE/BANDAGES/DRESSINGS) IMPLANT
CNTNR URN SCR LID CUP LEK RST (MISCELLANEOUS) ×2 IMPLANT
DERMABOND ADVANCED .7 DNX12 (GAUZE/BANDAGES/DRESSINGS) ×2 IMPLANT
DRAPE SURG IRRIG POUCH 19X23 (DRAPES) ×4 IMPLANT
DRAPE TOWEL STERILE LF 18X12 (DRAPES) ×2 IMPLANT
ELECT REM PT RETURN 9FT ADLT (ELECTROSURGICAL) ×2 IMPLANT
ELECTRODE REM PT RTRN 9FT ADLT (ELECTROSURGICAL) ×2 IMPLANT
GAUZE 4X4 16PLY ~~LOC~~+RFID DBL (SPONGE) IMPLANT
GAUZE SPONGE 4X4 12PLY STRL (GAUZE/BANDAGES/DRESSINGS) IMPLANT
GLOVE BIOGEL PI IND STRL 7.0 (GLOVE) ×8 IMPLANT
GLOVE ECLIPSE 7.0 STRL STRAW (GLOVE) ×2 IMPLANT
GOWN STRL REUS W/ TWL LRG LVL3 (GOWN DISPOSABLE) ×2 IMPLANT
GOWN STRL REUS W/ TWL XL LVL3 (GOWN DISPOSABLE) ×4 IMPLANT
IRRIG SUCT STRYKERFLOW 2 WTIP (MISCELLANEOUS) ×2 IMPLANT
IRRIGATION SUCT STRKRFLW 2 WTP (MISCELLANEOUS) ×2 IMPLANT
KIT PINK PAD W/HEAD ARE REST (MISCELLANEOUS) ×2 IMPLANT
KIT PINK PAD W/HEAD ARM REST (MISCELLANEOUS) ×2 IMPLANT
KIT TURNOVER KIT B (KITS) ×2 IMPLANT
LIGASURE BLUNT TIP 5 LONG 44CM (ELECTROSURGICAL) IMPLANT
MANIFOLD NEPTUNE II (INSTRUMENTS) ×2 IMPLANT
NDL INSUFFLATION 14GA 120MM (NEEDLE) IMPLANT
NEEDLE INSUFFLATION 14GA 120MM (NEEDLE) IMPLANT
NS IRRIG 1000ML POUR BTL (IV SOLUTION) ×2 IMPLANT
PACK LAPAROSCOPY BASIN (CUSTOM PROCEDURE TRAY) ×2 IMPLANT
PAD OB MATERNITY 11 LF (PERSONAL CARE ITEMS) ×2 IMPLANT
POUCH LAPAROSCOPIC INSTRUMENT (MISCELLANEOUS) ×2 IMPLANT
SET TUBE SMOKE EVAC HIGH FLOW (TUBING) ×2 IMPLANT
SHEARS HARMONIC ACE PLUS 36CM (ENDOMECHANICALS) IMPLANT
SLEEVE SCD COMPRESS KNEE MED (STOCKING) ×2 IMPLANT
SLEEVE Z-THREAD 5X100MM (TROCAR) ×2 IMPLANT
SUT VIC AB 4-0 PS2 18 (SUTURE) ×2 IMPLANT
SUT VICRYL 0 UR6 27IN ABS (SUTURE) ×2 IMPLANT
SYS BAG RETRIEVAL 10MM (BASKET) IMPLANT
SYSTEM BAG RETRIEVAL 10MM (BASKET) IMPLANT
SYSTEM CARTER THOMASON II (TROCAR) IMPLANT
TRAY FOLEY W/BAG SLVR 14FR LF (SET/KITS/TRAYS/PACK) ×2 IMPLANT
TROCAR Z-THREAD BLADED 11X100M (TROCAR) IMPLANT
TROCAR Z-THREAD FIOS 5X100MM (TROCAR) ×2 IMPLANT
WARMER LAPAROSCOPE (MISCELLANEOUS) ×2 IMPLANT

## 2024-02-06 NOTE — Brief Op Note (Signed)
 02/06/2024  9:46 AM  PATIENT:  Theresa Bernard  38 y.o. female  PRE-OPERATIVE DIAGNOSIS:  Undesired Fertility  POST-OPERATIVE DIAGNOSIS:  Undesired Fertility  PROCEDURE:  Procedure(s): SALPINGECTOMY, BILATERAL, LAPAROSCOPIC (Bilateral) REMOVAL, INTRAUTERINE DEVICE (N/A) Removal of nexplanon device  SURGEON:  Surgeons and Role:    Lorriane Shire, MD - Primary  PHYSICIAN ASSISTANT: Clement Sayres, PA  ASSISTANTS: none   ANESTHESIA:   local and general  EBL:  5 mL   BLOOD ADMINISTERED:none  DRAINS: none   LOCAL MEDICATIONS USED:  BUPIVICAINE   SPECIMEN:  Source of Specimen:  bilateral fallopian tubes  DISPOSITION OF SPECIMEN:  PATHOLOGY  COUNTS:  YES  TOURNIQUET:  * No tourniquets in log *  DICTATION: .Note written in EPIC  PLAN OF CARE: Discharge to home after PACU  PATIENT DISPOSITION:  PACU - hemodynamically stable.   Delay start of Pharmacological VTE agent (>24hrs) due to surgical blood loss or risk of bleeding: not applicable

## 2024-02-06 NOTE — Discharge Instructions (Addendum)
 Post-surgical Instructions, Outpatient Surgery  You may expect to feel dizzy, weak, and drowsy for as long as 24 hours after receiving the medicine that made you sleep (anesthetic). For the first 24 hours after your surgery:   Do not drive a car, ride a bicycle, participate in physical activities, or take public transportation until you are done taking narcotic pain medicines or as directed by Dr. Briscoe Deutscher.  Do not drink alcohol or take tranquilizers.  Do not take medicine that has not been prescribed by your physicians.  Do not sign important papers or make important decisions while on narcotic pain medicines.  Have a responsible person with you.   CARE OF INCISION If you have a bandage, you may remove it in one day.  If there are steri-strips or dermabond, just let this loosen on its own.  You may shower on the first day after your surgery.  Do not sit in a tub bath for one week. Avoid heavy lifting (more than 10 pounds/4.5 kilograms), pushing, or pulling.  Avoid activities that may risk injury to your incisions.   PAIN MANAGEMENT Ibuprofen 800mg .  (This is the same as 4-200mg  over the counter tablets of Motrin or ibuprofen.)  Take this every 6 hours or as needed for cramping.   Acetaminophen 1000mg  (This is the same as 2-500mg  over the counter extra strength tylenol). Take this every 6 hours for the first 3 days or as needed afterwards for pain Oxycodone 5mg  For more severe pain, take one or two tablets every four to six hours as needed for pain control.  (Remember that narcotic pain medications increase your risk of constipation.  If this becomes a problem, you may take an over the counter laxative like miralax.)  DO'S AND DON'T'S Do not take a tub bath for 1 weeks.  You may shower on the first day after your surgery Do not do any heavy lifting for one to two weeks.  This increases the chance of bleeding. Do move around as you feel able.  Stairs are fine.  You may begin to exercise again as  you feel able.  Do not lift any weights for two weeks. Do not put anything in the vagina for two weeks--no tampons, intercourse, or douching.    REGULAR MEDIATIONS/VITAMINS: You may restart all of your regular medications as prescribed. You may restart all of your vitamins as you normally take them.    PLEASE CALL OR SEEK MEDICAL CARE IF: You have persistent nausea and vomiting.  You have trouble eating or drinking.  You have an oral temperature above 100.5.  You have constipation that is not helped by adjusting diet or increasing fluid intake. Pain medicines are a common cause of constipation.  You have heavy vaginal bleeding You have redness or drainage from your incision(s) or there is increasing pain or tenderness near or in the surgical site.          Post Anesthesia Home Care Instructions  Activity: Get plenty of rest for the remainder of the day. A responsible individual must stay with you for 24 hours following the procedure.  For the next 24 hours, DO NOT: -Drive a car -Advertising copywriter -Drink alcoholic beverages -Take any medication unless instructed by your physician -Make any legal decisions or sign important papers.  Meals: Start with liquid foods such as gelatin or soup. Progress to regular foods as tolerated. Avoid greasy, spicy, heavy foods. If nausea and/or vomiting occur, drink only clear liquids until the nausea and/or  vomiting subsides. Call your physician if vomiting continues.  Special Instructions/Symptoms: Your throat may feel dry or sore from the anesthesia or the breathing tube placed in your throat during surgery. If this causes discomfort, gargle with warm salt water. The discomfort should disappear within 24 hours.  If you had a scopolamine patch placed behind your ear for the management of post- operative nausea and/or vomiting:  1. The medication in the patch is effective for 72 hours, after which it should be removed.  Wrap patch in a tissue  and discard in the trash. Wash hands thoroughly with soap and water. 2. You may remove the patch earlier than 72 hours if you experience unpleasant side effects which may include dry mouth, dizziness or visual disturbances. 3. Avoid touching the patch. Wash your hands with soap and water after contact with the patch.         No acetaminophen/Tylenol until after 1:15 pm today if needed.

## 2024-02-06 NOTE — Anesthesia Procedure Notes (Signed)
 Procedure Name: Intubation Date/Time: 02/06/2024 8:58 AM  Performed by: Stanton Kidney, CRNAPre-anesthesia Checklist: Patient identified, Patient being monitored, Timeout performed, Emergency Drugs available and Suction available Patient Re-evaluated:Patient Re-evaluated prior to induction Oxygen Delivery Method: Circle system utilized Preoxygenation: Pre-oxygenation with 100% oxygen Induction Type: IV induction Ventilation: Mask ventilation without difficulty Laryngoscope Size: 3 and Miller Grade View: Grade I Tube type: Oral Tube size: 7.0 mm Number of attempts: 1 Airway Equipment and Method: Stylet Placement Confirmation: ETT inserted through vocal cords under direct vision, positive ETCO2 and breath sounds checked- equal and bilateral Secured at: 21 cm Tube secured with: Tape Dental Injury: Teeth and Oropharynx as per pre-operative assessment  Comments: Atraumatic, dentition unchanged

## 2024-02-06 NOTE — Op Note (Signed)
 Theresa Bernard PROCEDURE DATE: 02/06/2024  PREOPERATIVE DIAGNOSIS: undesired fertility  POSTOPERATIVE DIAGNOSIS: undesired fertility PROCEDURE:    nexplanon removal, laparoscopic bilateral salpingectomy SURGEON: Lorriane Shire, MD ASSISTANT:  Clement Sayres, PA    An experienced assistant was required given the standard of surgical care given the complexity of the case.  This assistant was needed for exposure, dissection, suctioning, retraction, instrument exchange, and for overall help during the procedure.  INDICATIONS: 38 y.o. [id number] with undesired fertility.  Risks of surgery were discussed with the patient including but not limited to: bleeding which may require transfusion; infection which may require antibiotics; injury to surrounding organs; need for additional procedures including laparotomy;  and other postoperative/anesthesia complications. Written informed consent was obtained.    FINDINGS:  nexplanon palpated in upper arm  Normal external genitalia, normal appearing cervix Laparoscopically: normal upper abdominal survey, normal uterus, normal bilateral fallopian tubes, normal bilateral ovaries, normal anterior cul de sac, normal posterior cul de sac   ANESTHESIA: General INTRAVENOUS FLUIDS:  600 ml of LR ESTIMATED BLOOD LOSS:  5 ml URINE OUTPUT: 100 ml SPECIMENS: bilateral fallopian tubes COMPLICATIONS:  None immediate.  PROCEDURE:  The patient was taken to the operating room where general anesthesia was obtained without difficulty.  She was then placed in the dorsal lithotomy position and prepared and draped in sterile fashion.  After an adequate timeout was performed, the upper left arm was prepped and draped.  Using an 11 blade, the skin was incised and the implant was removed intact. The skin was cleaned, incision covered with Steri-Strips, and an adhesive bandage.  After the arm was dressed, attention turned below and foley catheter was inserted using sterile technique. A  bivalved speculum was then placed in the patient's vagina, and the anterior lip of cervix grasped with the hulka tenaculum.  The uterine manipulator was then advanced into the uterus.  The speculum was removed from the vagina.  Attention was then turned to the patient's abdomen where a 5-mm skin incision was made in the umbilical fold.  The 5-mm trocar and sleeve were then advanced without difficulty with the laparoscope under direct visualization into the abdomen.  The abdomen was then insufflated with carbon dioxide gas.  Adequate pneumoperitoneum was obtained.  A survey of the patient's pelvis and abdomen revealed the findings above.  Bilateral 5-mm lower quadrant ports  were then placed under direct visualization.  The fallopian tubes were transected from the uterine attachments and the underlying mesosalpinx with the Ligasure device allowing for bilateral salpingectomy.  The fallopian tubes were then removed from the abdomen under direct visualization.  The operative site was surveyed, and it was found to be hemostatic.   No intraoperative injury to other surrounding organs was noted.  The abdomen was desufflated and all instruments were then removed from the patient's abdomen.  All skin incisions were closed with 4-0 vicryl. They were also covered with dermabond.  The uterine manipulator was removed from the vagina without complications and the foley catheter removed as well. The patient tolerated the procedure well.  Sponge, lap, and needle counts were correct times two.  The patient was then taken to the recovery room awake, extubated and in stable condition.  The patient will be discharged to home as per PACU criteria.  Routine postoperative instructions given.    Lorriane Shire, MD Minimally Invasive Gynecologic Surgery  Obstetrics and Gynecology, [facility], [facility] 02/06/2024

## 2024-02-06 NOTE — Anesthesia Postprocedure Evaluation (Signed)
 Anesthesia Post Note  Patient: Editor, commissioning  Procedure(s) Performed: SALPINGECTOMY, BILATERAL, LAPAROSCOPIC (Bilateral: Abdomen) REMOVAL, INTRAUTERINE DEVICE (Arm Upper)     Patient location during evaluation: PACU Anesthesia Type: General Level of consciousness: awake and alert Pain management: pain level controlled Vital Signs Assessment: post-procedure vital signs reviewed and stable Respiratory status: spontaneous breathing, nonlabored ventilation, respiratory function stable and patient connected to nasal cannula oxygen Cardiovascular status: blood pressure returned to baseline and stable Postop Assessment: no apparent nausea or vomiting Anesthetic complications: no  No notable events documented.  Last Vitals:  Vitals:   02/06/24 1030 02/06/24 1037  BP: 125/80 120/80  Pulse: 65 65  Resp: 17 15  Temp: 36.5 C   SpO2: 95% 95%    Last Pain:  Vitals:   02/06/24 1030  TempSrc:   PainSc: 3                  Delwyn Scoggin L Averiana Clouatre

## 2024-02-06 NOTE — Interval H&P Note (Signed)
 History and Physical Interval Note:  02/06/2024 7:22 AM  Theresa Bernard  has presented today for surgery, with the diagnosis of Undesired Fertility.  The various methods of treatment have been discussed with the patient and family. After consideration of risks, benefits and other options for treatment, the patient has consented to  Procedure(s): SALPINGECTOMY, BILATERAL, LAPAROSCOPIC (Bilateral) REMOVAL, INTRAUTERINE DEVICE (N/A) as a surgical intervention.  The patient's history has been reviewed, patient examined, no change in status, stable for surgery.  I have reviewed the patient's chart and labs.  Questions were answered to the patient's satisfaction.     [name]

## 2024-02-06 NOTE — Transfer of Care (Signed)
 Immediate Anesthesia Transfer of Care Note  Patient: Theresa Bernard  Procedure(s) Performed: SALPINGECTOMY, BILATERAL, LAPAROSCOPIC (Bilateral: Abdomen) REMOVAL, INTRAUTERINE DEVICE (Arm Upper)  Patient Location: PACU  Anesthesia Type:General  Level of Consciousness: awake, alert , oriented, and patient cooperative  Airway & Oxygen Therapy: Patient Spontanous Breathing  Post-op Assessment: Report given to RN, Post -op Vital signs reviewed and stable, and Patient moving all extremities X 4  Post vital signs: Reviewed and stable  Last Vitals:  Vitals Value Taken Time  BP 116/69 02/06/24 1004  Temp    Pulse 84 02/06/24 1010  Resp 15 02/06/24 1010  SpO2 97 % 02/06/24 1010  Vitals shown include unfiled device data.  Last Pain:  Vitals:   02/06/24 0704  TempSrc:   PainSc: 0-No pain      Patients Stated Pain Goal: 0 (02/06/24 0704)  Complications: No notable events documented.

## 2024-02-07 ENCOUNTER — Encounter (HOSPITAL_COMMUNITY): Payer: Self-pay | Admitting: Obstetrics and Gynecology

## 2024-02-07 ENCOUNTER — Encounter: Payer: Self-pay | Admitting: Obstetrics and Gynecology

## 2024-02-07 LAB — SURGICAL PATHOLOGY

## 2024-02-22 ENCOUNTER — Telehealth: Admitting: Obstetrics and Gynecology

## 2024-02-22 DIAGNOSIS — Z4889 Encounter for other specified surgical aftercare: Secondary | ICD-10-CM

## 2024-02-22 DIAGNOSIS — Z975 Presence of (intrauterine) contraceptive device: Secondary | ICD-10-CM

## 2024-02-22 DIAGNOSIS — Z09 Encounter for follow-up examination after completed treatment for conditions other than malignant neoplasm: Secondary | ICD-10-CM

## 2024-02-22 NOTE — Progress Notes (Signed)
 GYNECOLOGY VIRTUAL VISIT ENCOUNTER NOTE  Provider location: [facility] for [facility]   Patient location: Home  I connected with Theresa Bernard on 02/22/24 at 11:15 AM EDT by MyChart Video Encounter and verified that I am speaking with the correct person using two identifiers.   I discussed the limitations, risks, security and privacy concerns of performing an evaluation and management service virtually and the availability of in person appointments. I also discussed with the patient that there may be a patient responsible charge related to this service. The patient expressed understanding and agreed to proceed.   History:  Theresa Bernard is a 38 y.o. [phone] female being evaluated today for postop follow up.  She has been doing well since surgery.  She only use oxycodone  for the first 2 days.  Glue has fallen off all of her incisions and there are no concerns with her incisions.  She has been able to void and have BM without issue.  Has been able to tolerate p.o.  Site of Nexplanon removal has been doing well.  She is curious when she can resume intercourse.     Past Medical History:  Diagnosis Date   Anemia    Anxiety    Depression    Past Surgical History:  Procedure Laterality Date   IUD REMOVAL N/A 02/06/2024   Procedure: REMOVAL, INTRAUTERINE DEVICE;  Surgeon: Kadyn Chovan, MD;  Location: [facility] OR;  Service: Gynecology;  Laterality: N/A;   LAPAROSCOPIC BILATERAL SALPINGECTOMY Bilateral 02/06/2024   Procedure: SALPINGECTOMY, BILATERAL, LAPAROSCOPIC;  Surgeon: Kiki Pelton, MD;  Location: [facility] OR;  Service: Gynecology;  Laterality: Bilateral;   TONSILLECTOMY  1991   WISDOM TOOTH EXTRACTION     The following portions of the patient's history were reviewed and updated as appropriate: allergies, current medications, past family history, past medical history, past social history, past surgical history and problem list.   Health Maintenance:      Component Value  Date/Time   DIAGPAP  01/08/2022 0956    - Negative for intraepithelial lesion or malignancy (NILM)   HPVHIGH Positive (A) 01/08/2022 0956   ADEQPAP  01/08/2022 0956    Satisfactory for evaluation; transformation zone component PRESENT.     Review of Systems:  Pertinent items noted in HPI and remainder of comprehensive ROS otherwise negative.  Physical Exam:   General:  Alert, oriented and cooperative. Patient appears to be in no acute distress.  Mental Status: Normal mood and affect. Normal behavior. Normal judgment and thought content.   Respiratory: Normal respiratory effort, no problems with respiration noted  Rest of physical exam deferred due to type of encounter  Labs and Imaging FINAL MICROSCOPIC DIAGNOSIS:   A. FALLOPIAN TUBES, BILATERAL, SALPINGECTOMY:  - Segments of bilateral fallopian tubes with complete cross section    Assessment and Plan:     1. Postop check (Primary) Doing well and meeting all postoperative goals at this time.  Reviewed benign surgical pathology as well as intraoperative photos.  2. Nexplanon in place Resolved with Nexplanon removal during procedure.      I discussed the assessment and treatment plan with the patient. The patient was provided an opportunity to ask questions and all were answered. The patient agreed with the plan and demonstrated an understanding of the instructions.   The patient was advised to call back or seek an in-person evaluation/go to the ED if the symptoms worsen or if the condition fails to improve as anticipated.  I provided 5 minutes of face-to-face  time during this encounter. I also spent 5 minutes dedicated to the care of this patient including pre-visit review of records, post visit ordering of medications and appropriate tests or procedures, coordinating care and documenting this visit encounter.    Kiki Pelton, MD Center for [employer], [facility]
